# Patient Record
Sex: Female | Born: 1985 | Race: White | Hispanic: No | Marital: Married | Smoking: Never smoker
Health system: Southern US, Community
[De-identification: ages and names within clinical notes are randomized; demographics above are authoritative.]

## PROBLEM LIST (undated history)

## (undated) ENCOUNTER — Inpatient Hospital Stay (HOSPITAL_COMMUNITY): Payer: Self-pay

## (undated) DIAGNOSIS — G43009 Migraine without aura, not intractable, without status migrainosus: Secondary | ICD-10-CM

## (undated) DIAGNOSIS — M2550 Pain in unspecified joint: Secondary | ICD-10-CM

## (undated) DIAGNOSIS — S239XXA Sprain of unspecified parts of thorax, initial encounter: Secondary | ICD-10-CM

## (undated) DIAGNOSIS — M62838 Other muscle spasm: Secondary | ICD-10-CM

## (undated) DIAGNOSIS — J329 Chronic sinusitis, unspecified: Secondary | ICD-10-CM

## (undated) DIAGNOSIS — R202 Paresthesia of skin: Secondary | ICD-10-CM

## (undated) DIAGNOSIS — G35 Multiple sclerosis: Secondary | ICD-10-CM

## (undated) HISTORY — PX: NO PAST SURGERIES: SHX2092

## (undated) HISTORY — DX: Chronic sinusitis, unspecified: J32.9

## (undated) HISTORY — DX: Other muscle spasm: M62.838

## (undated) HISTORY — DX: Migraine without aura, not intractable, without status migrainosus: G43.009

## (undated) HISTORY — DX: Sprain of unspecified parts of thorax, initial encounter: S23.9XXA

## (undated) HISTORY — DX: Pain in unspecified joint: M25.50

## (undated) HISTORY — DX: Paresthesia of skin: R20.2

---
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
## 2014-02-01 ENCOUNTER — Encounter: Payer: Self-pay | Admitting: *Deleted

## 2014-02-01 ENCOUNTER — Telehealth: Payer: Self-pay | Admitting: *Deleted

## 2014-02-01 NOTE — Telephone Encounter (Signed)
Patient was r/s for 02/02/14 at 2 pm, per Carollee Herter

## 2014-02-01 NOTE — Telephone Encounter (Signed)
Spoke with patient to r/s her appointment with Dr. Hosie PoissonSumner, patient was offered 3 pm slot on the same day or an earlier time on another day, states tomorrow is her day off and would need an earlier appointment with any doctor that is available to see her on tomorrow, [facility] to see what can be done.

## 2014-02-02 ENCOUNTER — Encounter (INDEPENDENT_AMBULATORY_CARE_PROVIDER_SITE_OTHER): Payer: Self-pay

## 2014-02-02 ENCOUNTER — Encounter: Payer: Self-pay | Admitting: Neurology

## 2014-02-02 ENCOUNTER — Ambulatory Visit: Payer: PRIVATE HEALTH INSURANCE | Admitting: Neurology

## 2014-02-02 ENCOUNTER — Ambulatory Visit (INDEPENDENT_AMBULATORY_CARE_PROVIDER_SITE_OTHER): Payer: PRIVATE HEALTH INSURANCE | Admitting: Neurology

## 2014-02-02 VITALS — BP 137/89 | HR 94 | Ht 69.0 in | Wt 239.0 lb

## 2014-02-02 DIAGNOSIS — R202 Paresthesia of skin: Secondary | ICD-10-CM

## 2014-02-02 DIAGNOSIS — R209 Unspecified disturbances of skin sensation: Secondary | ICD-10-CM

## 2014-02-02 HISTORY — DX: Paresthesia of skin: R20.2

## 2014-02-02 NOTE — Progress Notes (Signed)
[facility]    Provider:  Dr Hosie Poisson Referring Provider: Margrett Rud, PA-C Primary Care Physician:  No primary provider on file.  CC:  paresthesias  HPI:  Linda Griffith is a 28 y.o. female here as a referral from Dr. Roger Shelter for paresthesias  Patient presents with new onset hand paresthesias in April which have progressed to involve the whole body. Symptoms started in her feet, then hands and progressed to involve the whole body below the neck. Has slightly improved and now not involving the distal portion of her extremities. Described as a constant sensation, swollen and hot/cold sensation, does not fluctuate. Will also feel an occasional cramping sensation in her hands. Has subjective weakness but no true weakness noted, has some difficulty with fine motor tasks. No gait instability. No change with bowel/bladder. No facial sensory or motor changes. No change in vision. No recent illnesses, fevers, colds. No recent medication changes. She was prescribed prednisone by urgent care but did not take. No recent travel, no tic bites.   In the past few years she has had severe migraines, starts with severe blurry vision followed by a headaches. No history of focal motor or sensory changes. Otherwise healthy. Miscarriage in October. History of bulging lumbar disc in October. Currently managing a restaurant. Social EtOH use.   Review of Systems: Out of a complete 14 system review, the patient complains of only the following symptoms, and all other reviewed systems are negative. + headaches, numbness  History   Social History   Marital Status: Married    Spouse Name: N/A    Number of Children: N/A   Years of Education: N/A   Occupational History   Not on file.   Social History Main Topics   Smoking status: Never Smoker    Smokeless tobacco: Never Used   Alcohol Use: Yes     Comment: occ 3-6 drinks per week   Drug Use: No   Sexual Activity: Yes   Other Topics  Concern   Not on file   Social History Narrative   Married, no children   Right handed   Some college   1 cup daily    No family history on file.  Past Medical History  Diagnosis Date   Migraine without aura    Joint pain     knee, and lower leg   Sinusitis    Sprain of thoracic region     Thoracic region   Muscle spasms of lower extremity     No past surgical history on file.  No current outpatient prescriptions on file.   No current facility-administered medications for this visit.    Allergies as of 02/02/2014 - Review Complete 02/02/2014  Allergen Reaction Noted   Amoxicillin  02/01/2014   Penicillins Hives and Rash 02/01/2014    Vitals: BP 137/89   Pulse 94   Ht 5\' 9"  (1.753 m)   Wt 239 lb (108.41 kg)   BMI 35.28 kg/m2 Last Weight:  Wt Readings from Last 1 Encounters:  02/02/14 239 lb (108.41 kg)   Last Height:   Ht Readings from Last 1 Encounters:  02/02/14 5\' 9"  (1.753 m)     Physical exam: Exam: Gen: NAD, conversant Eyes: anicteric sclerae, moist conjunctivae HENT: Atraumatic, oropharynx clear Neck: Trachea midline; supple,  Lungs: CTA, no wheezing, rales, rhonic                          CV: RRR, no  MRG Abdomen: Soft, non-tender;  Extremities: No peripheral edema  Skin: Normal temperature, no rash,  Psych: Appropriate affect, pleasant  Neuro: MS: AA&Ox3, appropriately interactive, normal affect   Speech: fluent w/o paraphasic error  Memory: good recent and remote recall  CN: PERRL, EOMI no nystagmus, no ptosis, sensation intact to LT V1-V3 bilat, face symmetric, no weakness, hearing grossly intact, palate elevates symmetrically, shoulder shrug 5/5 bilat,  tongue protrudes midline, no fasiculations noted.  Motor: normal bulk and tone Strength: 5/5  In all extremities  Coord: rapid alternating and point-to-point (FNF, HTS) movements intact.  Reflexes: symmetrical, bilat downgoing toes  Sens: LT intact in all extremities  with exception of decreased LT in ulnar distribution of bilateral hands. Negative Tinels, Phalens  Gait: posture, stance, stride and arm-swing normal. Tandem gait intact. Able to walk on heels and toes. Romberg absent.   Assessment:  After physical and neurologic examination, review of laboratory studies, imaging, neurophysiology testing and pre-existing records, assessment will be reviewed on the problem list.  Plan:  Treatment plan and additional workup will be reviewed under Problem List.  1)Paresthesias  27y/o woman presenting for initial evaluation of diffuse paresthesias. Unclear etiology. With age and symptom presentation would need to consider an autoimmune disorder such as MS. With signs of bilateral ulnar neuropathy would consider a cervical process or an underlying disorder of the peripheral nerves. Will check MRI brain and C spine. Will check lab workup. Can consider EMG/NCS in the future. Follow up once workup completed.    Elspeth ChoPeter Daquane Aguilar, DO  [facility] [address] [place], [place] [id number]  Phone [phone] Fax [phone]

## 2014-02-02 NOTE — Patient Instructions (Signed)
Overall you are doing fairly well but I do want to suggest a few things today:   As far as diagnostic testing:  1)Please have some blood work completed today after you check out 2)I would like you to have a MRI of your brain and cervical spine. You will be called to schedule this  I will call you once the above is completed to discuss next steps. Please call us with any interim questions, concerns, problems, updates or refill requests.   My clinical assistant and will answer any of your questions and relay your messages to me and also relay most of my messages to you.   Our phone number is [phone]. We also have an after hours call service for urgent matters and there is a physician on-call for urgent questions. For any emergencies you know to call 911 or go to the nearest emergency room

## 2014-02-03 NOTE — Progress Notes (Signed)
Quick Note:  Shared negative lab results with patient per Dr Minus Breeding findings and patient said that she would like for him to call her to discuss in more details ______

## 2014-02-05 LAB — PROTEIN ELECTROPHORESIS
A/G Ratio: 1.6 (ref 0.7–2.0)
ALPHA 2: 0.6 g/dL (ref 0.4–1.2)
Albumin ELP: 4.2 g/dL (ref 3.2–5.6)
Alpha 1: 0.2 g/dL (ref 0.1–0.4)
BETA: 0.9 g/dL (ref 0.6–1.3)
GLOBULIN, TOTAL: 2.6 g/dL (ref 2.0–4.5)
Gamma Globulin: 0.9 g/dL (ref 0.5–1.6)
Total Protein: 6.8 g/dL (ref 6.0–8.5)

## 2014-02-05 LAB — C-REACTIVE PROTEIN: CRP: 2.1 mg/L (ref 0.0–4.9)

## 2014-02-05 LAB — METHYLMALONIC ACID(MMA), RND URINE

## 2014-02-05 LAB — VITAMIN B12: Vitamin B-12: 408 pg/mL (ref 211–946)

## 2014-02-05 LAB — ANA W/REFLEX IF POSITIVE: Anti Nuclear Antibody(ANA): NEGATIVE

## 2014-02-05 LAB — SEDIMENTATION RATE: Sed Rate: 6 mm/hr (ref 0–32)

## 2014-02-17 ENCOUNTER — Ambulatory Visit (INDEPENDENT_AMBULATORY_CARE_PROVIDER_SITE_OTHER): Payer: PRIVATE HEALTH INSURANCE

## 2014-02-17 DIAGNOSIS — R202 Paresthesia of skin: Secondary | ICD-10-CM

## 2014-02-17 DIAGNOSIS — R209 Unspecified disturbances of skin sensation: Secondary | ICD-10-CM

## 2014-02-17 MED ORDER — GADOPENTETATE DIMEGLUMINE 469.01 MG/ML IV SOLN: 20.0000 mL | Freq: Once | INTRAVENOUS | Status: AC | PRN

## 2014-02-19 ENCOUNTER — Other Ambulatory Visit: Payer: Self-pay | Admitting: Neurology

## 2014-02-19 ENCOUNTER — Telehealth: Payer: Self-pay | Admitting: Neurology

## 2014-02-19 DIAGNOSIS — R202 Paresthesia of skin: Secondary | ICD-10-CM

## 2014-02-19 NOTE — Telephone Encounter (Signed)
Called patient back. Will order LP for MS workup. Patient expressed  Understanding and agreement.

## 2014-02-23 ENCOUNTER — Telehealth: Payer: Self-pay | Admitting: *Deleted

## 2014-02-23 NOTE — Telephone Encounter (Signed)
I called pt and relayed the plan.  [facility] to call. She verbalized understanding.

## 2014-02-23 NOTE — Telephone Encounter (Signed)
I called and LMVM for Danielle at [facility] about LP ordered 02-19-14 on pt.

## 2014-02-24 NOTE — Telephone Encounter (Signed)
LMVM for Linda Griffith about scheduling LP for pt.

## 2014-02-24 NOTE — Telephone Encounter (Signed)
Spoke with Duwayne Heck, and she is aware of order for LP.  Did contact pt.

## 2014-03-01 DIAGNOSIS — G35 Multiple sclerosis: Secondary | ICD-10-CM

## 2014-03-01 HISTORY — DX: Multiple sclerosis: G35

## 2014-03-08 ENCOUNTER — Ambulatory Visit
Admission: RE | Admit: 2014-03-08 | Discharge: 2014-03-08 | Disposition: A | Discharge status: Home / Self Care | Payer: PRIVATE HEALTH INSURANCE | Source: Ambulatory Visit | Attending: Neurology | Admitting: Neurology

## 2014-03-08 VITALS — BP 135/70 | HR 83

## 2014-03-08 DIAGNOSIS — R202 Paresthesia of skin: Secondary | ICD-10-CM

## 2014-03-08 LAB — CSF CELL COUNT WITH DIFFERENTIAL
RBC COUNT CSF: 6 uL — AB
Tube #: 1
WBC CSF: 7 uL — AB (ref 0–5)

## 2014-03-08 LAB — GRAM STAIN: GRAM STAIN: NONE SEEN

## 2014-03-08 LAB — GLUCOSE, CSF: GLUCOSE CSF: 58 mg/dL (ref 43–76)

## 2014-03-08 LAB — PROTEIN, CSF: Total Protein, CSF: 47 mg/dL — ABNORMAL HIGH (ref 15–45)

## 2014-03-08 NOTE — Discharge Instructions (Signed)

## 2014-03-08 NOTE — Procedures (Addendum)
°

## 2014-03-08 NOTE — Progress Notes (Signed)
Blood drawn from right AC to go with spinal fluid. Site is unremarkable and pt tolerated procedure well.

## 2014-03-09 ENCOUNTER — Telehealth: Payer: Self-pay | Admitting: *Deleted

## 2014-03-09 NOTE — Telephone Encounter (Signed)
Spoke with patient to inform that the physician has to review the results once received which are not ready yet  then we will call with those LP results. Explained that per Dr Skip EstimableSumner's lov notes, will f/u once workup is completed, she verbalized understanding.

## 2014-03-12 ENCOUNTER — Telehealth: Payer: Self-pay | Admitting: Neurology

## 2014-03-12 LAB — MYELIN BASIC PROTEIN, CSF

## 2014-03-12 LAB — OLIGOCLONAL BANDS, CSF + SERM

## 2014-03-12 NOTE — Telephone Encounter (Signed)
Called patient no answer. Left a message stating to call the office back to set up an appointment on 03-16-14 at 2:45pm for a 3 pm appointment.

## 2014-03-12 NOTE — Telephone Encounter (Signed)
Patient calling to get results from Lumbar Puncture.  Please return call.

## 2014-03-12 NOTE — Telephone Encounter (Signed)
Please let her know I am still waiting for one test to come back. It may take a few more days. Please have her schedule a follow up with me for 6/16 or 6/17 at 3pm. At that point we will have everything back and can discuss next steps. Thanks.

## 2014-03-12 NOTE — Telephone Encounter (Signed)
Patient called back and spoke to GP and she is now scheduled for 03-16-14 @ 3 pm.

## 2014-03-12 NOTE — Telephone Encounter (Signed)
Patient is calling wanting to know if LP results are back. Please advise

## 2014-03-13 LAB — HSV(HERPES SMPLX VRS)ABS-I+II(IGG)-CSF

## 2014-03-16 ENCOUNTER — Encounter: Payer: Self-pay | Admitting: Neurology

## 2014-03-16 ENCOUNTER — Ambulatory Visit (INDEPENDENT_AMBULATORY_CARE_PROVIDER_SITE_OTHER): Payer: PRIVATE HEALTH INSURANCE | Admitting: Neurology

## 2014-03-16 VITALS — BP 133/73 | HR 107 | Ht 69.0 in | Wt 238.0 lb

## 2014-03-16 DIAGNOSIS — R202 Paresthesia of skin: Secondary | ICD-10-CM

## 2014-03-16 DIAGNOSIS — R209 Unspecified disturbances of skin sensation: Secondary | ICD-10-CM

## 2014-03-16 NOTE — Progress Notes (Signed)
[facility]    Provider:  Dr Hosie Poisson Referring Provider: No ref. provider found Primary Care Physician:  No primary provider on file.  CC:  paresthesias  HPI:  Linda Griffith is a 28 y.o. female here as a follow up from Dr. No ref. provider found for paresthesias. Last visit was 02/02/2014 at which time she underwent a MS workup. This showed a mildly enhancing cervical spine lesion and >5 oligoclonal bands in the CSF.   Prior visit 01/2014 Patient presents with new onset hand paresthesias in April which have progressed to involve the whole body. Symptoms started in her feet, then hands and progressed to involve the whole body below the neck. Has slightly improved and now not involving the distal portion of her extremities. Described as a constant sensation, swollen and hot/cold sensation, does not fluctuate. Will also feel an occasional cramping sensation in her hands. Has subjective weakness but no true weakness noted, has some difficulty with fine motor tasks. No gait instability. No change with bowel/bladder. No facial sensory or motor changes. No change in vision. No recent illnesses, fevers, colds. No recent medication changes. She was prescribed prednisone by urgent care but did not take. No recent travel, no tic bites.   In the past few years she has had severe migraines, starts with severe blurry vision followed by a headaches. No history of focal motor or sensory changes. Otherwise healthy. Miscarriage in October. History of bulging lumbar disc in October. Currently managing a restaurant. Social EtOH use.   Review of Systems: Out of a complete 14 system review, the patient complains of only the following symptoms, and all other reviewed systems are negative. + headaches, numbness  History   Social History   Marital Status: Married    Spouse Name: N/A    Number of Children: N/A   Years of Education: N/A   Occupational History   Not on file.   Social  History Main Topics   Smoking status: Never Smoker    Smokeless tobacco: Never Used   Alcohol Use: Yes     Comment: occ 3-6 drinks per week   Drug Use: No   Sexual Activity: Yes   Other Topics Concern   Not on file   Social History Narrative   Married, no children   Right handed   Some college   1 cup daily    No family history on file.  Past Medical History  Diagnosis Date   Migraine without aura    Joint pain     knee, and lower leg   Sinusitis    Sprain of thoracic region     Thoracic region   Muscle spasms of lower extremity    Paresthesias 02/02/2014    No past surgical history on file.  No current outpatient prescriptions on file.   No current facility-administered medications for this visit.    Allergies as of 03/16/2014 - Review Complete 03/16/2014  Allergen Reaction Noted   Amoxicillin  02/01/2014   Penicillins Hives and Rash 02/01/2014    Vitals: BP 133/73   Pulse 107   Ht 5\' 9"  (1.753 m)   Wt 238 lb (107.956 kg)   BMI 35.13 kg/m2 Last Weight:  Wt Readings from Last 1 Encounters:  03/16/14 238 lb (107.956 kg)   Last Height:   Ht Readings from Last 1 Encounters:  03/16/14 5\' 9"  (1.753 m)     Physical exam: Exam: Gen: NAD, conversant Eyes: anicteric sclerae, moist conjunctivae HENT: Atraumatic, oropharynx  clear Neck: Trachea midline; supple,  Lungs: CTA, no wheezing, rales, rhonic                          CV: RRR, no MRG Abdomen: Soft, non-tender;  Extremities: No peripheral edema  Skin: Normal temperature, no rash,  Psych: Appropriate affect, pleasant  Neuro: MS: AA&Ox3, appropriately interactive, normal affect   Speech: fluent w/o paraphasic error  Memory: good recent and remote recall  CN: PERRL, EOMI no nystagmus, no ptosis, sensation intact to LT V1-V3 bilat, face symmetric, no weakness, hearing grossly intact, palate elevates symmetrically, shoulder shrug 5/5 bilat,  tongue protrudes midline, no fasiculations  noted.  Motor: normal bulk and tone Strength: 5/5  In all extremities  Coord: rapid alternating and point-to-point (FNF, HTS) movements intact.  Reflexes: symmetrical, bilat downgoing toes  Sens: LT intact in all extremities with exception of decreased LT in ulnar distribution of bilateral hands. Negative Tinels, Phalens  Gait: posture, stance, stride and arm-swing normal. Tandem gait intact. Able to walk on heels and toes. Romberg absent.   Assessment:  After physical and neurologic examination, review of laboratory studies, imaging, neurophysiology testing and pre-existing records, assessment will be reviewed on the problem list.  Plan:  Treatment plan and additional workup will be reviewed under Problem List.  1)Possible MS  27y/o woman presenting for follow up evaluation of diffuse paresthesias. MRI imaging shows a solitary mildly enhancing lesion at the level of C2. LP shows greater than 5 oligoclonal bands. Based on solitary lesion this would represent a CIS but suspect it is likely a case of MS. Discussed this extensively with the patient and her mother. We discussed different therapeutic options vs watchful waiting. They wish to think about it at this time. We will discuss further once they have time to do further research.    A total of 45 minutes was spent in with this patient. Over half this time was spent on counseling patient on the diagnosis and different therapeutic options available.     Elspeth ChoPeter Amilio Zehnder, DO  [facility] [address] [place], [place] [id number]  Phone [phone] Fax [phone]

## 2014-07-26 IMAGING — RF DG FLUORO GUIDE LUMBAR PUNCTURE
1 series · 1 of 1 positions shown · non-contrast
Comparison: none

CLINICAL DATA: Hand tingling

[Series 1: dg fluoro guide lumbar puncture · 1 of 1 slices shown]
[im 1/1]
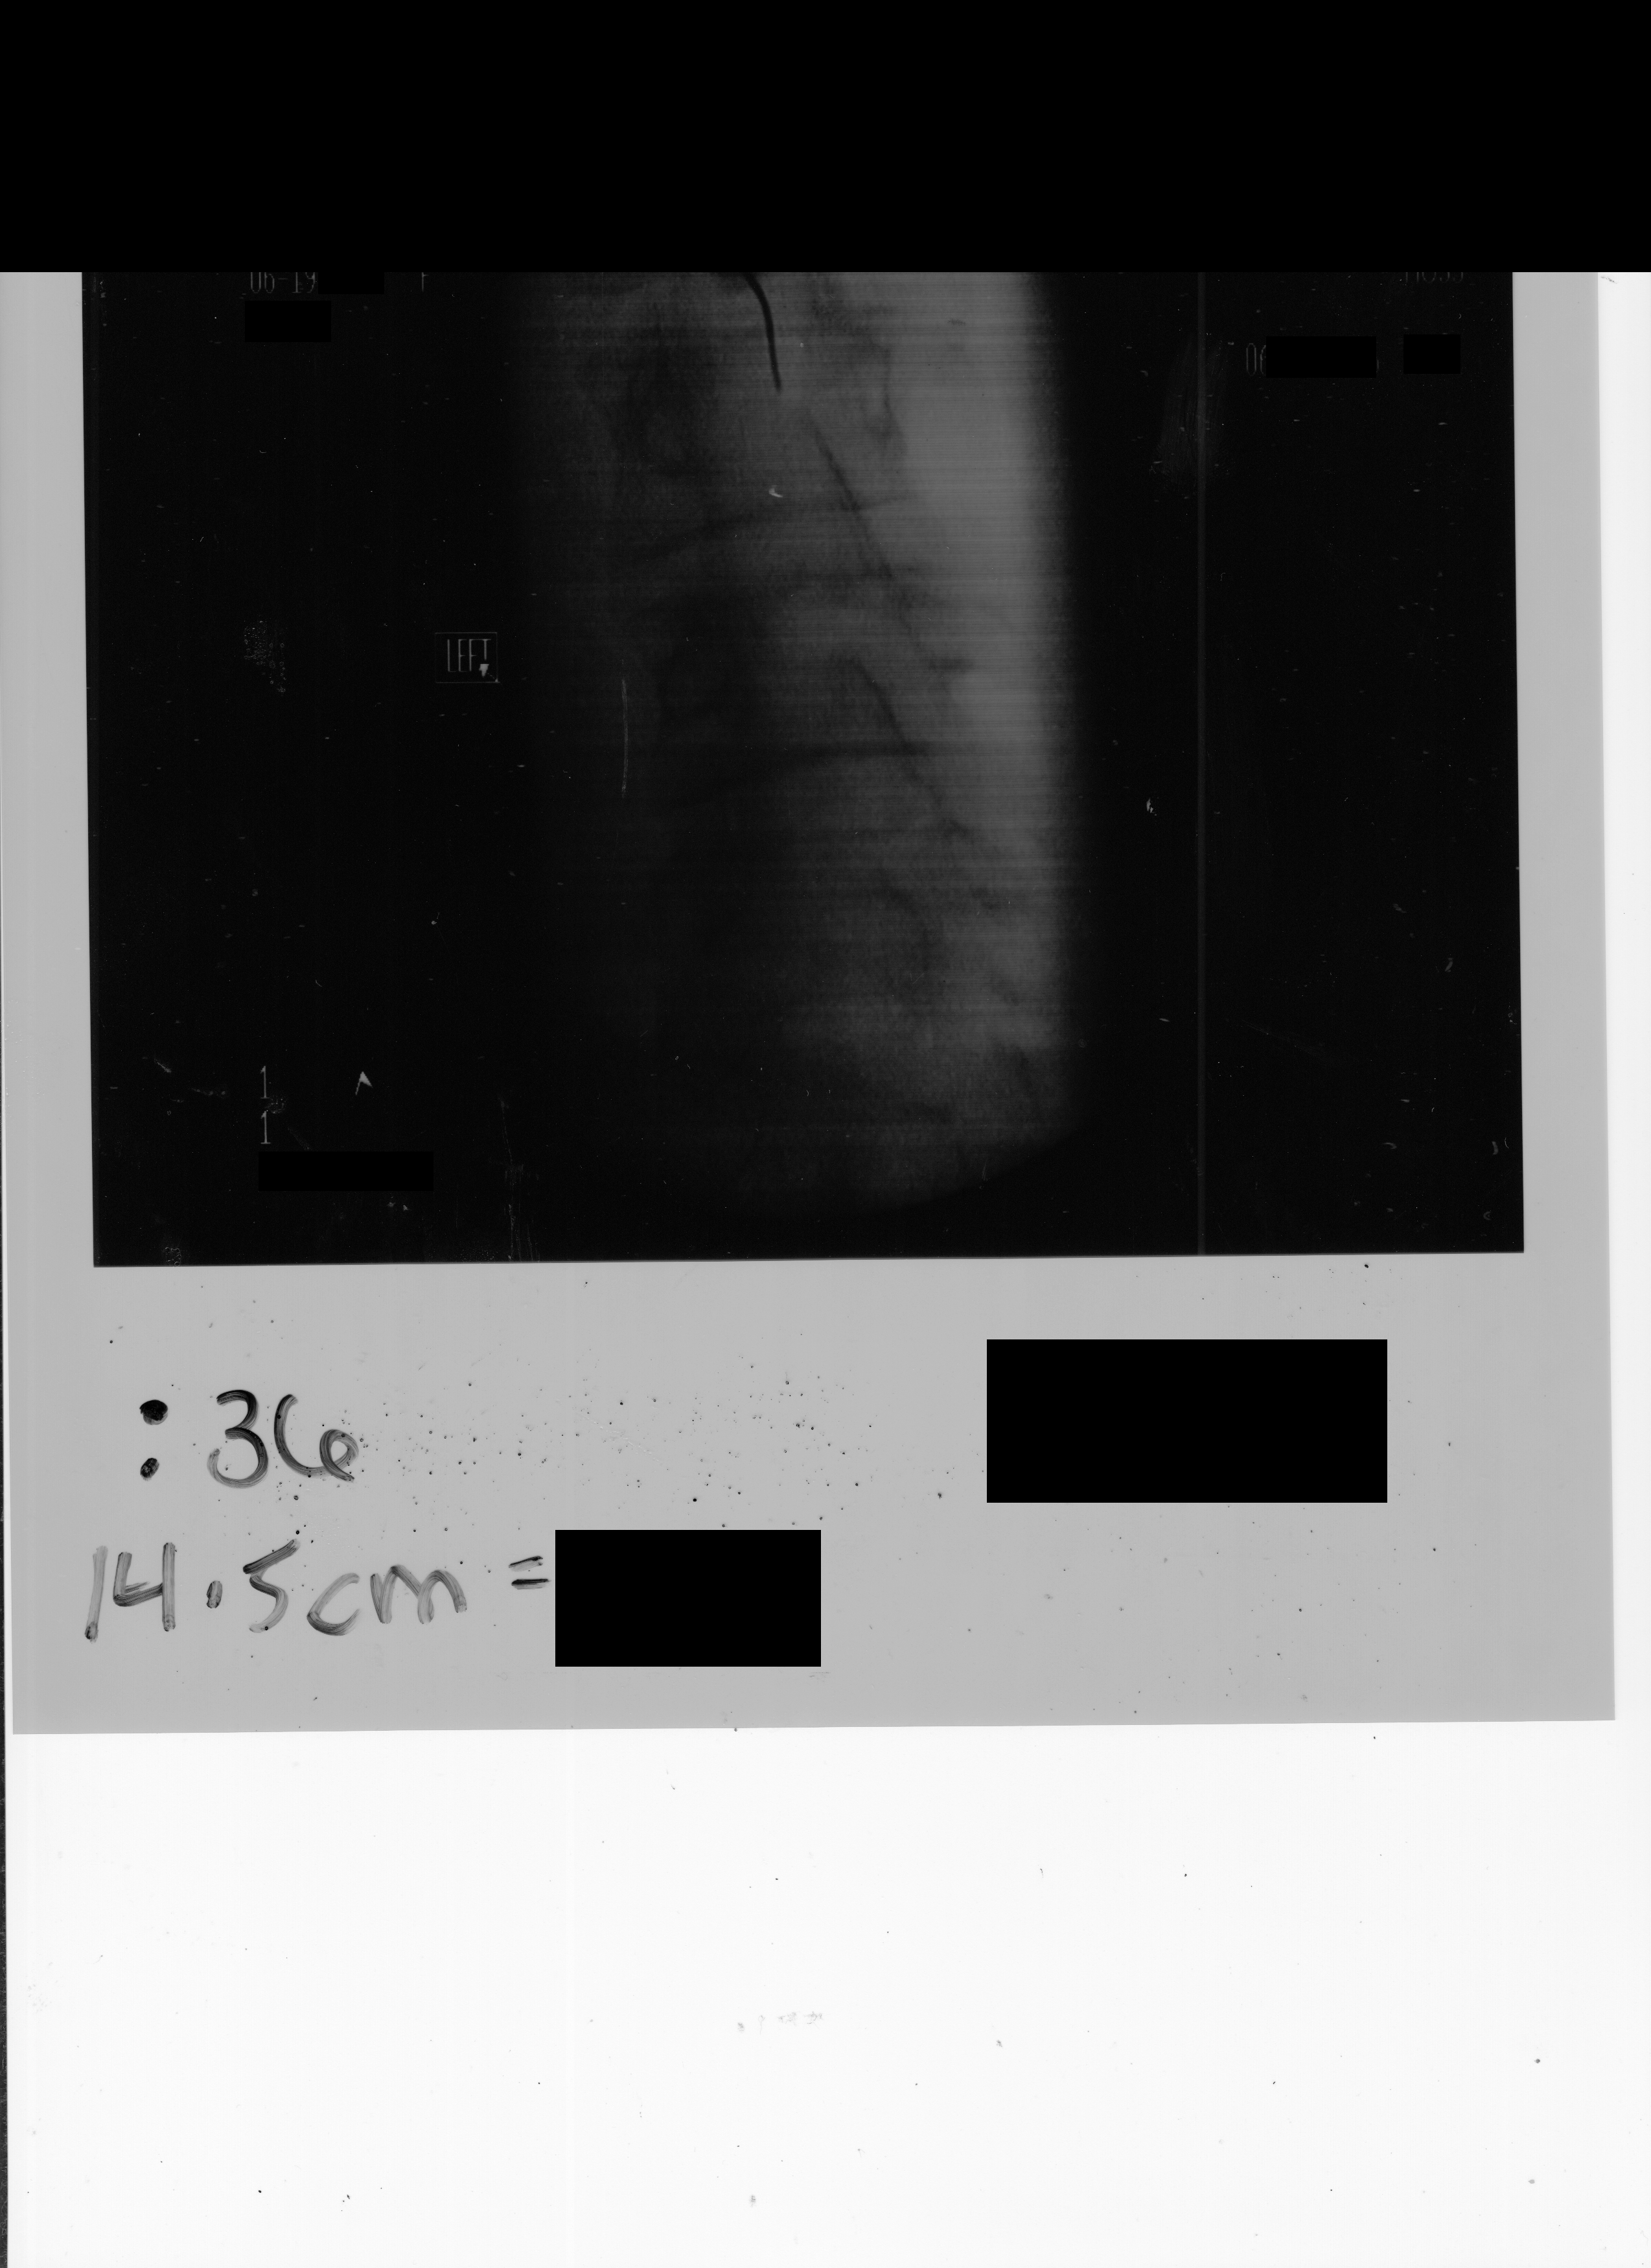

[1 of 1 positions shown; findings below may reference images not displayed]

EXAM:
DIAGNOSTIC LUMBAR PUNCTURE UNDER FLUOROSCOPIC GUIDANCE

FLUOROSCOPY TIME:  36 seconds.

PROCEDURE:
Informed consent was obtained from the patient prior to the
procedure, including potential complications of headache, allergy,
and pain. With the patient prone, the lower back was prepped with
Betadine. 1% Lidocaine was used for local anesthesia. Lumbar
puncture was performed at the L2-3 level using a 20 gauge needle
with return of clear CSF with an opening pressure of 14.5 cm water.
Twelve ml of CSF were obtained for laboratory studies. The patient
tolerated the procedure well and there were no apparent
complications.
IMPRESSION: Successful lumbar puncture for fluid and pressure measurements.

## 2014-09-28 LAB — OB RESULTS CONSOLE RUBELLA ANTIBODY, IGM: RUBELLA: IMMUNE

## 2014-09-28 LAB — OB RESULTS CONSOLE RPR: RPR: NONREACTIVE

## 2014-09-28 LAB — OB RESULTS CONSOLE HEPATITIS B SURFACE ANTIGEN: Hepatitis B Surface Ag: NEGATIVE

## 2014-09-28 LAB — OB RESULTS CONSOLE HIV ANTIBODY (ROUTINE TESTING): HIV: NONREACTIVE

## 2014-10-12 LAB — OB RESULTS CONSOLE GC/CHLAMYDIA
Chlamydia: NEGATIVE
GC PROBE AMP, GENITAL: NEGATIVE

## 2014-11-01 ENCOUNTER — Inpatient Hospital Stay (HOSPITAL_COMMUNITY)
Admission: AD | Admit: 2014-11-01 | Discharge: 2014-11-02 | Disposition: A | Discharge status: Home / Self Care | Payer: PRIVATE HEALTH INSURANCE | Source: Ambulatory Visit | Attending: Obstetrics and Gynecology | Admitting: Obstetrics and Gynecology

## 2014-11-01 ENCOUNTER — Encounter (HOSPITAL_COMMUNITY): Payer: Self-pay | Admitting: *Deleted

## 2014-11-01 DIAGNOSIS — O210 Mild hyperemesis gravidarum: Secondary | ICD-10-CM | POA: Diagnosis present

## 2014-11-01 DIAGNOSIS — Z3A13 13 weeks gestation of pregnancy: Secondary | ICD-10-CM | POA: Insufficient documentation

## 2014-11-01 DIAGNOSIS — O219 Vomiting of pregnancy, unspecified: Secondary | ICD-10-CM

## 2014-11-01 HISTORY — DX: Multiple sclerosis: G35

## 2014-11-01 LAB — URINALYSIS, ROUTINE W REFLEX MICROSCOPIC
GLUCOSE, UA: NEGATIVE mg/dL
LEUKOCYTES UA: NEGATIVE
NITRITE: NEGATIVE
PH: 6 (ref 5.0–8.0)
Protein, ur: NEGATIVE mg/dL
Specific Gravity, Urine: >1.03 — ABNORMAL HIGH (ref 1.005–1.030)
Urobilinogen, UA: 0.2 mg/dL (ref 0.0–1.0)

## 2014-11-01 LAB — URINE MICROSCOPIC-ADD ON

## 2014-11-01 MED ORDER — DEXTROSE IN LACTATED RINGERS 5 % IV SOLN
Freq: Once | INTRAVENOUS | Status: AC
  Administered 2014-11-01: 23:00:00 via INTRAVENOUS

## 2014-11-01 MED ORDER — SODIUM CHLORIDE 0.9 % IV SOLN
25.0000 mg | Freq: Once | INTRAVENOUS | Status: AC
  Administered 2014-11-01: 25 mg via INTRAVENOUS
  Filled 2014-11-01: qty 1

## 2014-11-01 NOTE — Discharge Instructions (Signed)

## 2014-11-01 NOTE — MAU Provider Note (Signed)
°  History     CSN: [id number]  Arrival date and time: 11/01/14 1948   None     No chief complaint on file.  HPI This is a 29 y.o. female at [redacted]w[redacted]d who presents with c/o nausea and vomiting since running out of Diclegis on Friday. Was hoping N/V would get better so did not refill it. Did get good relief from it when she was on it.   RN note: PT SAYS SHE HAS BEEN NAUSEAED WHOLE TIME- WAS TAKING DIGLEGIS-  VOMITED X15-20 X TODAY. LAST ATE- AT 2 PROTEINS SHE CALLED DR       OB History    Gravida Para Term Preterm AB TAB SAB Ectopic Multiple Living   3    1  1          Past Medical History  Diagnosis Date   Migraine without aura    Joint pain     knee, and lower leg   Sinusitis    Sprain of thoracic region     Thoracic region   Muscle spasms of lower extremity    Paresthesias 02/02/2014   Multiple sclerosis 03/2014    Past Surgical History  Procedure Laterality Date   No past surgeries      History reviewed. No pertinent family history.  History  Substance Use Topics   Smoking status: Never Smoker    Smokeless tobacco: Never Used   Alcohol Use: Yes     Comment: occ 3-6 drinks per week    Allergies:  Allergies  Allergen Reactions   Amoxicillin    Penicillins Hives and Rash    No prescriptions prior to admission    Review of Systems  Constitutional: Positive for malaise/fatigue. Negative for fever and chills.  Gastrointestinal: Positive for nausea and vomiting. Negative for abdominal pain, diarrhea and constipation.  Neurological: Positive for weakness. Negative for dizziness.   Physical Exam   Blood pressure 123/73, pulse 104, temperature 99.6 F (37.6 C), temperature source Oral, resp. rate 20, height 5\' 7"  (1.702 m), weight 219 lb (99.338 kg).  Physical Exam  Constitutional: She is oriented to person, place, and time. She appears well-developed and well-nourished.  HENT:  Head: Normocephalic.   Cardiovascular: Normal rate and normal heart sounds.  Exam reveals no gallop and no friction rub.   No murmur heard. Respiratory: Effort normal and breath sounds normal. No respiratory distress.  GI: Soft.  Musculoskeletal: Normal range of motion.  Neurological: She is alert and oriented to person, place, and time.  Skin: Skin is warm and dry.  Psychiatric: She has a normal mood and affect.    MAU Course  Procedures  MDM Will give 2 liters of fluid, including one with Phenergan. >> felt better afterward.  Able to tolerate ginger ale   Assessment and Plan  A:  SIUP at [redacted]w[redacted]d       Nausea and vomiting  P:  Discharge home       Advised to take Diclegis as ordered       Follow up in office  [facility] 11/01/2014, 8:55 PM

## 2014-11-01 NOTE — MAU Note (Signed)
PT  SAYS SHE HAS BEEN NAUSEAED   WHOLE  TIME-  WAS TAKING  DIGLEGIS-        VOMITED   X15-20 X   TODAY.    LAST  ATE- AT  2  PROTEINS    SHE    CALLED  DR

## 2015-04-11 LAB — OB RESULTS CONSOLE GBS: GBS: POSITIVE

## 2015-04-29 ENCOUNTER — Inpatient Hospital Stay (HOSPITAL_COMMUNITY)
Admission: AD | Admit: 2015-04-29 | Discharge: 2015-05-02 | DRG: 774 | Disposition: A | Discharge status: Home / Self Care | Payer: PRIVATE HEALTH INSURANCE | Source: Ambulatory Visit | Attending: Obstetrics and Gynecology | Admitting: Obstetrics and Gynecology

## 2015-04-29 ENCOUNTER — Encounter (HOSPITAL_COMMUNITY): Payer: Self-pay | Admitting: *Deleted

## 2015-04-29 DIAGNOSIS — Z8249 Family history of ischemic heart disease and other diseases of the circulatory system: Secondary | ICD-10-CM

## 2015-04-29 DIAGNOSIS — O1490 Unspecified pre-eclampsia, unspecified trimester: Secondary | ICD-10-CM | POA: Diagnosis present

## 2015-04-29 DIAGNOSIS — O99354 Diseases of the nervous system complicating childbirth: Secondary | ICD-10-CM | POA: Diagnosis present

## 2015-04-29 DIAGNOSIS — R51 Headache: Secondary | ICD-10-CM | POA: Diagnosis present

## 2015-04-29 DIAGNOSIS — O1493 Unspecified pre-eclampsia, third trimester: Secondary | ICD-10-CM | POA: Diagnosis present

## 2015-04-29 DIAGNOSIS — G35 Multiple sclerosis: Secondary | ICD-10-CM | POA: Diagnosis present

## 2015-04-29 DIAGNOSIS — Z3A39 39 weeks gestation of pregnancy: Secondary | ICD-10-CM | POA: Diagnosis present

## 2015-04-29 LAB — ABO/RH: ABO/RH(D): A POS

## 2015-04-29 LAB — CBC WITH DIFFERENTIAL/PLATELET
BASOS ABS: 0 10*3/uL (ref 0.0–0.1)
Basophils Relative: 0 % (ref 0–1)
Eosinophils Absolute: 0.1 10*3/uL (ref 0.0–0.7)
Eosinophils Relative: 1 % (ref 0–5)
HCT: 38.4 % (ref 36.0–46.0)
Hemoglobin: 13.5 g/dL (ref 12.0–15.0)
LYMPHS PCT: 11 % — AB (ref 12–46)
Lymphs Abs: 1.3 10*3/uL (ref 0.7–4.0)
MCH: 33.9 pg (ref 26.0–34.0)
MCHC: 35.2 g/dL (ref 30.0–36.0)
MCV: 96.5 fL (ref 78.0–100.0)
Monocytes Absolute: 0.7 10*3/uL (ref 0.1–1.0)
Monocytes Relative: 6 % (ref 3–12)
NEUTROS ABS: 10.2 10*3/uL — AB (ref 1.7–7.7)
Neutrophils Relative %: 82 % — ABNORMAL HIGH (ref 43–77)
Platelets: 180 10*3/uL (ref 150–400)
RBC: 3.98 MIL/uL (ref 3.87–5.11)
RDW: 12.8 % (ref 11.5–15.5)
WBC: 12.3 10*3/uL — AB (ref 4.0–10.5)

## 2015-04-29 LAB — COMPREHENSIVE METABOLIC PANEL
ALT: 16 U/L (ref 14–54)
AST: 17 U/L (ref 15–41)
Albumin: 3.7 g/dL (ref 3.5–5.0)
Alkaline Phosphatase: 116 U/L (ref 38–126)
Anion gap: 5 (ref 5–15)
BUN: 11 mg/dL (ref 6–20)
CHLORIDE: 105 mmol/L (ref 101–111)
CO2: 24 mmol/L (ref 22–32)
Calcium: 9.4 mg/dL (ref 8.9–10.3)
Creatinine, Ser: 0.74 mg/dL (ref 0.44–1.00)
GFR calc Af Amer: >60 mL/min (ref >60)
Glucose, Bld: 75 mg/dL (ref 65–99)
Potassium: 4.5 mmol/L (ref 3.5–5.1)
Sodium: 134 mmol/L — ABNORMAL LOW (ref 135–145)
TOTAL PROTEIN: 6.8 g/dL (ref 6.5–8.1)
Total Bilirubin: 0.7 mg/dL (ref 0.3–1.2)

## 2015-04-29 LAB — PROTEIN / CREATININE RATIO, URINE
Creatinine, Urine: 168 mg/dL
Protein Creatinine Ratio: 0.1 mg/mg{Cre} (ref 0.00–0.15)
TOTAL PROTEIN, URINE: 17 mg/dL

## 2015-04-29 LAB — URINALYSIS, ROUTINE W REFLEX MICROSCOPIC
BILIRUBIN URINE: NEGATIVE
Glucose, UA: NEGATIVE mg/dL
Hgb urine dipstick: NEGATIVE
KETONES UR: 15 mg/dL — AB
Leukocytes, UA: NEGATIVE
Nitrite: NEGATIVE
Protein, ur: NEGATIVE mg/dL
Specific Gravity, Urine: >1.03 — ABNORMAL HIGH (ref 1.005–1.030)
UROBILINOGEN UA: 0.2 mg/dL (ref 0.0–1.0)
pH: 5.5 (ref 5.0–8.0)

## 2015-04-29 LAB — LACTATE DEHYDROGENASE: LDH: 163 U/L (ref 98–192)

## 2015-04-29 LAB — URIC ACID: URIC ACID, SERUM: 6.1 mg/dL (ref 2.3–6.6)

## 2015-04-29 LAB — TYPE AND SCREEN
ABO/RH(D): A POS
Antibody Screen: NEGATIVE

## 2015-04-29 MED ORDER — OXYCODONE-ACETAMINOPHEN 5-325 MG PO TABS
2.0000 | ORAL_TABLET | ORAL | Status: DC | PRN
Start: 2015-04-29 — End: 2015-04-30

## 2015-04-29 MED ORDER — TERBUTALINE SULFATE 1 MG/ML IJ SOLN: 0.2500 mg | Freq: Once | INTRAMUSCULAR | Status: AC | PRN

## 2015-04-29 MED ORDER — LIDOCAINE HCL (PF) 1 % IJ SOLN
30.0000 mL | INTRAMUSCULAR | Status: DC | PRN
Start: 2015-04-29 — End: 2015-04-30
  Administered 2015-04-30: 30 mL via SUBCUTANEOUS
  Filled 2015-04-29: qty 30

## 2015-04-29 MED ORDER — OXYCODONE-ACETAMINOPHEN 5-325 MG PO TABS
1.0000 | ORAL_TABLET | ORAL | Status: DC | PRN
Start: 2015-04-29 — End: 2015-04-30

## 2015-04-29 MED ORDER — MAGNESIUM SULFATE BOLUS VIA INFUSION
4.0000 g | Freq: Once | INTRAVENOUS | Status: AC
  Administered 2015-04-29: 4 g via INTRAVENOUS
  Filled 2015-04-29: qty 500

## 2015-04-29 MED ORDER — BUTORPHANOL TARTRATE 1 MG/ML IJ SOLN: 1.0000 mg | INTRAMUSCULAR | Status: DC | PRN

## 2015-04-29 MED ORDER — CITRIC ACID-SODIUM CITRATE 334-500 MG/5ML PO SOLN
30.0000 mL | ORAL | Status: DC | PRN
  Filled 2015-04-29: qty 30

## 2015-04-29 MED ORDER — OXYTOCIN 40 UNITS IN LACTATED RINGERS INFUSION - SIMPLE MED: 62.5000 mL/h | INTRAVENOUS | Status: DC

## 2015-04-29 MED ORDER — LACTATED RINGERS IV SOLN
INTRAVENOUS | Status: DC
Start: 2015-04-29 — End: 2015-04-30
  Administered 2015-04-29 – 2015-04-30 (×3): via INTRAVENOUS

## 2015-04-29 MED ORDER — CLINDAMYCIN PHOSPHATE 900 MG/50ML IV SOLN
900.0000 mg | Freq: Three times a day (TID) | INTRAVENOUS | Status: DC
  Administered 2015-04-30: 900 mg via INTRAVENOUS
  Filled 2015-04-29 (×3): qty 50

## 2015-04-29 MED ORDER — OXYTOCIN BOLUS FROM INFUSION
500.0000 mL | INTRAVENOUS | Status: DC
  Administered 2015-04-30: 500 mL via INTRAVENOUS

## 2015-04-29 MED ORDER — MISOPROSTOL 25 MCG QUARTER TABLET
25.0000 ug | ORAL_TABLET | ORAL | Status: DC | PRN
  Administered 2015-04-29: 25 ug via VAGINAL
  Filled 2015-04-29: qty 1
  Filled 2015-04-29: qty 0.25

## 2015-04-29 MED ORDER — ACETAMINOPHEN 325 MG PO TABS: 650.0000 mg | ORAL_TABLET | ORAL | Status: DC | PRN

## 2015-04-29 MED ORDER — ONDANSETRON HCL 4 MG/2ML IJ SOLN: 4.0000 mg | Freq: Four times a day (QID) | INTRAMUSCULAR | Status: DC | PRN

## 2015-04-29 MED ORDER — MAGNESIUM SULFATE 50 % IJ SOLN
2.0000 g/h | INTRAVENOUS | Status: DC
  Filled 2015-04-29: qty 80

## 2015-04-29 MED ORDER — LACTATED RINGERS IV SOLN: 500.0000 mL | INTRAVENOUS | Status: DC | PRN

## 2015-04-29 MED ORDER — FLEET ENEMA 7-19 GM/118ML RE ENEM: 1.0000 | ENEMA | RECTAL | Status: DC | PRN

## 2015-04-29 MED ORDER — FAMOTIDINE 40 MG/5ML PO SUSR: 40.0000 mg | Freq: Every day | ORAL | Status: DC

## 2015-04-29 MED ORDER — FAMOTIDINE 20 MG PO TABS
40.0000 mg | ORAL_TABLET | Freq: Every day | ORAL | Status: DC
  Administered 2015-04-29: 40 mg via ORAL
  Filled 2015-04-29: qty 2

## 2015-04-29 NOTE — Progress Notes (Signed)
Cx 1/60/-2 vtx D/W patient 2 stage induction and risks

## 2015-04-29 NOTE — MAU Note (Addendum)
C/o headache earlier today and some visual changes earlier also; having high BP readings today; sent by OB's office for PIH eval;

## 2015-04-29 NOTE — MAU Note (Signed)
Early this morning had visual blurring, hx of migraines- this is how they start.  Did feel a headache starting, drank some caffeine.  No longer has blurring, does have dull HA.  About later left hand went numb, then arm, then left side of face and tongue; this was around 1200; lasted about .  No longer having these symptoms.  Has been dx with MS.  Denies BP concerns with preg.

## 2015-04-29 NOTE — MAU Note (Signed)
Urine in lab

## 2015-04-29 NOTE — MAU Provider Note (Signed)
History     CSN: [id number]  Arrival date and time: 04/29/15 1507   First Provider Initiated Contact with Patient 04/29/15 1623      Chief Complaint  Patient presents with   Headache   Blurred Vision   HPI  Ms. Linda Griffith is a 29 y.o. G3P0010 at [redacted]w[redacted]d who presents to MAU today with complaint of elevated blood pressure. The patient has been taking BPs at home as advised by her OB. She states that earlier today she had headache and blurred vision, which resolved spontaneously. She denies floaters, abdominal pain, peripheral edema, contractions, vaginal bleeding or LOF. She reports good fetal movement.   OB History    Gravida Para Term Preterm AB TAB SAB Ectopic Multiple Living   3    1  1          Past Medical History  Diagnosis Date   Migraine without aura    Joint pain     knee, and lower leg   Sinusitis    Sprain of thoracic region     Thoracic region   Muscle spasms of lower extremity    Paresthesias 02/02/2014   Multiple sclerosis 03/2014    Past Surgical History  Procedure Laterality Date   No past surgeries      Family History  Problem Relation Age of Onset   Hypertension Sister     History  Substance Use Topics   Smoking status: Never Smoker    Smokeless tobacco: Never Used   Alcohol Use: Yes     Comment: occ 3-6 drinks per week    Allergies:  Allergies  Allergen Reactions   Amoxicillin Rash   Penicillins Hives and Rash    Prescriptions prior to admission  Medication Sig Dispense Refill Last Dose   Prenatal Vit-Fe Fumarate-FA (PRENATAL MULTIVITAMIN) TABS tablet Take 1 tablet by mouth at bedtime.   04/28/2015 at Unknown time   ranitidine (ZANTAC) 150 MG tablet Take 150 mg by mouth at bedtime.   04/28/2015 at Unknown time    Review of Systems  Constitutional: Negative for fever and malaise/fatigue.  Eyes: Negative for blurred vision.       Neg - floaters  Gastrointestinal: Negative for nausea, vomiting, abdominal pain,  diarrhea and constipation.  Genitourinary: Negative for dysuria, urgency and frequency.       Neg - vaginal bleeding, discharge, LOF  Neurological: Negative for headaches.   Physical Exam   Blood pressure 156/101, pulse 83, temperature 98.2 F (36.8 C), temperature source Oral, resp. rate 20.  Physical Exam  Nursing note and vitals reviewed. Constitutional: She is oriented to person, place, and time. She appears well-developed and well-nourished. No distress.  HENT:  Head: Normocephalic and atraumatic.  Cardiovascular: Normal rate.   Respiratory: Effort normal.  GI: Soft. She exhibits no distension and no mass. There is no tenderness. There is no rebound and no guarding.  Musculoskeletal: She exhibits no edema.  Neurological: She is alert and oriented to person, place, and time. She has normal reflexes.  No clonus  Skin: Skin is warm and dry. No erythema.  Psychiatric: She has a normal mood and affect.    Results for orders placed or performed during the hospital encounter of 04/29/15 (from the past 24 hour(s))  Protein / creatinine ratio, urine     Status: None   Collection Time: 04/29/15  3:35 PM  Result Value Ref Range   Creatinine, Urine 168.00 mg/dL   Total Protein, Urine 17 mg/dL   Protein  Creatinine Ratio 0.10 0.00 - 0.15 mg/mg[Cre]  CBC with Differential/Platelet     Status: Abnormal   Collection Time: 04/29/15  4:34 PM  Result Value Ref Range   WBC 12.3 (H) 4.0 - 10.5 K/uL   RBC 3.98 3.87 - 5.11 MIL/uL   Hemoglobin 13.5 12.0 - 15.0 g/dL   HCT 16.1 09.6 - 04.5 %   MCV 96.5 78.0 - 100.0 fL   MCH 33.9 26.0 - 34.0 pg   MCHC 35.2 30.0 - 36.0 g/dL   RDW 40.9 81.1 - 91.4 %   Platelets 180 150 - 400 K/uL   Neutrophils Relative % 82 (H) 43 - 77 %   Neutro Abs 10.2 (H) 1.7 - 7.7 K/uL   Lymphocytes Relative 11 (L) 12 - 46 %   Lymphs Abs 1.3 0.7 - 4.0 K/uL   Monocytes Relative 6 3 - 12 %   Monocytes Absolute 0.7 0.1 - 1.0 K/uL   Eosinophils Relative 1 0 - 5 %    Eosinophils Absolute 0.1 0.0 - 0.7 K/uL   Basophils Relative 0 0 - 1 %   Basophils Absolute 0.0 0.0 - 0.1 K/uL  Comprehensive metabolic panel     Status: Abnormal (Preliminary result)   Collection Time: 04/29/15  4:34 PM  Result Value Ref Range   Sodium 134 (L) 135 - 145 mmol/L   Potassium 4.5 3.5 - 5.1 mmol/L   Chloride 105 101 - 111 mmol/L   CO2 24 22 - 32 mmol/L   Glucose, Bld 75 65 - 99 mg/dL   BUN 11 6 - 20 mg/dL   Creatinine, Ser 7.82 0.44 - 1.00 mg/dL   Calcium PENDING 8.9 - 10.3 mg/dL   Total Protein 6.8 6.5 - 8.1 g/dL   Albumin 3.7 3.5 - 5.0 g/dL   AST 17 15 - 41 U/L   ALT 16 14 - 54 U/L   Alkaline Phosphatase 116 38 - 126 U/L   Total Bilirubin 0.7 0.3 - 1.2 mg/dL   GFR calc non Af Amer >60 >60 mL/min   GFR calc Af Amer >60 >60 mL/min   Anion gap 5 5 - 15  Uric acid     Status: None   Collection Time: 04/29/15  4:34 PM  Result Value Ref Range   Uric Acid, Serum 6.1 2.3 - 6.6 mg/dL  Lactate dehydrogenase     Status: None   Collection Time: 04/29/15  4:34 PM  Result Value Ref Range   LDH 163 98 - 192 U/L    Fetal Monitoring: Baseline: 120 bpm, moderate variability, + accelerations, no decelerations Contractions: None  Patient Vitals for the past 24 hrs:  BP Temp Temp src Pulse Resp  04/29/15 1721 (!) 156/101 mmHg - - 83 -  04/29/15 1711 159/94 mmHg - - 77 -  04/29/15 1701 146/98 mmHg - - 89 -  04/29/15 1651 146/92 mmHg - - 85 -  04/29/15 1641 154/99 mmHg - - 85 -  04/29/15 1631 (!) 150/105 mmHg - - 87 -  04/29/15 1621 154/95 mmHg - - 86 -  04/29/15 1614 153/100 mmHg - - 89 20  04/29/15 1613 153/100 mmHg - - 90 -  04/29/15 1530 160/96 mmHg 98.2 F (36.8 C) Oral 83 20    MAU Course  Procedures None  MDM UA, Urine protein/creatinine ratio, uric acid, LDH, CBC and CMP today Discussed patient with Dr. Henderson Cloud. He plans to admit for induction for gestational HTN  Assessment and Plan  A: [facility] at [redacted]w[redacted]d  Gestation HTN  P: Admit to L&D for induction of  labor  Marny Lowenstein, PA-C  04/29/2015, 5:46 PM

## 2015-04-29 NOTE — H&P (Signed)
Linda Griffith is a 29 y.o. female presenting for C/O mild HA and spots in vision this am followed by numbness and tingling in left hand for about 30 minutes. Now all of these symptoms are resolved and she feels fine. Pregnancy complicated by history of MS diagnosed about 2 years ago but not treatment required. Symptoms at diagnosis match symptoms she had today. Also history of migraine HA. Maternal Medical History:  Fetal activity: Perceived fetal activity is normal.      OB History    Gravida Para Term Preterm AB TAB SAB Ectopic Multiple Living   Past Medical History  Diagnosis Date   Migraine without aura    Joint pain     knee, and lower leg   Sinusitis    Sprain of thoracic region     Thoracic region   Muscle spasms of lower extremity    Paresthesias 02/02/2014   Multiple sclerosis 03/2014   Past Surgical History  Procedure Laterality Date   No past surgeries     Family History: family history includes Hypertension in her sister. Social History:  reports that she has never smoked. She has never used smokeless tobacco. She reports that she drinks alcohol. She reports that she does not use illicit drugs.   Prenatal Transfer Tool  Maternal Diabetes: No Genetic Screening: Normal Maternal Ultrasounds/Referrals: Normal Fetal Ultrasounds or other Referrals:  None Maternal Substance Abuse:  No Significant Maternal Medications:  None Significant Maternal Lab Results:  None Other Comments:  None  Review of Systems  HENT:       Mild HA earlier today for about 30 minutes, now resolved  Eyes:       Saw spots earlier today for about 30 minutes, now resolved  Gastrointestinal: Negative for abdominal pain.      Blood pressure 156/101, pulse 83, temperature 98.2 F (36.8 C), temperature source Oral, resp. rate 20. Maternal Exam:  Uterine Assessment: Contraction strength is mild.  Abdomen: Patient reports no abdominal tenderness.   Fetal  Exam Fetal State Assessment: Category I - tracings are normal.     Physical Exam  Constitutional: She is oriented to person, place, and time.  Eyes: EOM are normal. Pupils are equal, round, and reactive to light.  Cardiovascular: Normal rate and regular rhythm.   Respiratory: Effort normal and breath sounds normal.  GI: Soft. There is no tenderness.  Neurological: She is alert and oriented to person, place, and time. She has normal reflexes.  Face symmetric Grip strength 3+ equal DTR 2+ equal, no clonus    Prenatal labs: ABO, Rh:   Antibody:   Rubella:   RPR:    HBsAg:    HIV:    GBS:     Results for orders placed or performed during the hospital encounter of 04/29/15 (from the past 24 hour(s))  Protein / creatinine ratio, urine     Status: None   Collection Time: 04/29/15  3:35 PM  Result Value Ref Range   Creatinine, Urine 168.00 mg/dL   Total Protein, Urine 17 mg/dL   Protein Creatinine Ratio 0.10 0.00 - 0.15 mg/mg[Cre]  Urinalysis, Routine w reflex microscopic (not at [facility])     Status: Abnormal   Collection Time: 04/29/15  3:35 PM  Result Value Ref Range   Color, Urine YELLOW YELLOW   APPearance CLEAR CLEAR   Specific Gravity, Urine >1.030 (H) 1.005 - 1.030   pH 5.5  5.0 - 8.0   Glucose, UA NEGATIVE NEGATIVE mg/dL   Hgb urine dipstick NEGATIVE NEGATIVE   Bilirubin Urine NEGATIVE NEGATIVE   Ketones, ur 15 (A) NEGATIVE mg/dL   Protein, ur NEGATIVE NEGATIVE mg/dL   Urobilinogen, UA 0.2 0.0 - 1.0 mg/dL   Nitrite NEGATIVE NEGATIVE   Leukocytes, UA NEGATIVE NEGATIVE  CBC with Differential/Platelet     Status: Abnormal   Collection Time: 04/29/15  4:34 PM  Result Value Ref Range   WBC 12.3 (H) 4.0 - 10.5 K/uL   RBC 3.98 3.87 - 5.11 MIL/uL   Hemoglobin 13.5 12.0 - 15.0 g/dL   HCT 16.1 09.6 - 04.5 %   MCV 96.5 78.0 - 100.0 fL   MCH 33.9 26.0 - 34.0 pg   MCHC 35.2 30.0 - 36.0 g/dL   RDW 40.9 81.1 - 91.4 %   Platelets 180 150 - 400 K/uL   Neutrophils Relative % 82  (H) 43 - 77 %   Neutro Abs 10.2 (H) 1.7 - 7.7 K/uL   Lymphocytes Relative 11 (L) 12 - 46 %   Lymphs Abs 1.3 0.7 - 4.0 K/uL   Monocytes Relative 6 3 - 12 %   Monocytes Absolute 0.7 0.1 - 1.0 K/uL   Eosinophils Relative 1 0 - 5 %   Eosinophils Absolute 0.1 0.0 - 0.7 K/uL   Basophils Relative 0 0 - 1 %   Basophils Absolute 0.0 0.0 - 0.1 K/uL  Comprehensive metabolic panel     Status: Abnormal   Collection Time: 04/29/15  4:34 PM  Result Value Ref Range   Sodium 134 (L) 135 - 145 mmol/L   Potassium 4.5 3.5 - 5.1 mmol/L   Chloride 105 101 - 111 mmol/L   CO2 24 22 - 32 mmol/L   Glucose, Bld 75 65 - 99 mg/dL   BUN 11 6 - 20 mg/dL   Creatinine, Ser 7.82 0.44 - 1.00 mg/dL   Calcium 9.4 8.9 - 95.6 mg/dL   Total Protein 6.8 6.5 - 8.1 g/dL   Albumin 3.7 3.5 - 5.0 g/dL   AST 17 15 - 41 U/L   ALT 16 14 - 54 U/L   Alkaline Phosphatase 116 38 - 126 U/L   Total Bilirubin 0.7 0.3 - 1.2 mg/dL   GFR calc non Af Amer >60 >60 mL/min   GFR calc Af Amer >60 >60 mL/min   Anion gap 5 5 - 15  Uric acid     Status: None   Collection Time: 04/29/15  4:34 PM  Result Value Ref Range   Uric Acid, Serum 6.1 2.3 - 6.6 mg/dL  Lactate dehydrogenase     Status: None   Collection Time: 04/29/15  4:34 PM  Result Value Ref Range   LDH 163 98 - 192 U/L    Assessment/Plan: 29 yo G3P0 @ 39 2/7 weeks and elevated BP C/W preeclampsia D/W patient and her mother Recommend delivery D/W 2 stage induction and risks Will begin magnesium sulfate   Linda Griffith II,Linda Griffith E 04/29/2015, 5:56 PM

## 2015-04-30 ENCOUNTER — Inpatient Hospital Stay (HOSPITAL_COMMUNITY): Payer: PRIVATE HEALTH INSURANCE | Admitting: Anesthesiology

## 2015-04-30 ENCOUNTER — Encounter (HOSPITAL_COMMUNITY): Payer: Self-pay | Admitting: Anesthesiology

## 2015-04-30 LAB — CBC
HCT: 39.4 % (ref 36.0–46.0)
HEMATOCRIT: 36.6 % (ref 36.0–46.0)
HEMOGLOBIN: 14.1 g/dL (ref 12.0–15.0)
HEMOGLOBIN: 12.9 g/dL (ref 12.0–15.0)
MCH: 34.1 pg — AB (ref 26.0–34.0)
MCH: 33.6 pg (ref 26.0–34.0)
MCHC: 35.8 g/dL (ref 30.0–36.0)
MCHC: 35.2 g/dL (ref 30.0–36.0)
MCV: 95.4 fL (ref 78.0–100.0)
MCV: 95.3 fL (ref 78.0–100.0)
PLATELETS: 180 10*3/uL (ref 150–400)
Platelets: 187 10*3/uL (ref 150–400)
RBC: 4.13 MIL/uL (ref 3.87–5.11)
RBC: 3.84 MIL/uL — AB (ref 3.87–5.11)
RDW: 12.8 % (ref 11.5–15.5)
RDW: 12.8 % (ref 11.5–15.5)
WBC: 12 10*3/uL — ABNORMAL HIGH (ref 4.0–10.5)
WBC: 15.7 10*3/uL — ABNORMAL HIGH (ref 4.0–10.5)

## 2015-04-30 LAB — COMPREHENSIVE METABOLIC PANEL
ALT: 15 U/L (ref 14–54)
AST: 23 U/L (ref 15–41)
Albumin: 3.2 g/dL — ABNORMAL LOW (ref 3.5–5.0)
Alkaline Phosphatase: 97 U/L (ref 38–126)
Anion gap: 6 (ref 5–15)
BUN: 8 mg/dL (ref 6–20)
CALCIUM: 7.4 mg/dL — AB (ref 8.9–10.3)
CO2: 20 mmol/L — ABNORMAL LOW (ref 22–32)
CREATININE: 0.71 mg/dL (ref 0.44–1.00)
Chloride: 105 mmol/L (ref 101–111)
GFR calc non Af Amer: >60 mL/min (ref >60)
GLUCOSE: 123 mg/dL — AB (ref 65–99)
Potassium: 3.6 mmol/L (ref 3.5–5.1)
SODIUM: 131 mmol/L — AB (ref 135–145)
TOTAL PROTEIN: 6.4 g/dL — AB (ref 6.5–8.1)
Total Bilirubin: 0.8 mg/dL (ref 0.3–1.2)

## 2015-04-30 LAB — RPR: RPR: NONREACTIVE

## 2015-04-30 LAB — MRSA PCR SCREENING: MRSA by PCR: NEGATIVE

## 2015-04-30 LAB — URIC ACID: Uric Acid, Serum: 6.2 mg/dL (ref 2.3–6.6)

## 2015-04-30 MED ORDER — SENNOSIDES-DOCUSATE SODIUM 8.6-50 MG PO TABS
2.0000 | ORAL_TABLET | ORAL | Status: DC
  Administered 2015-05-01: 2 via ORAL
  Filled 2015-04-30 (×2): qty 2

## 2015-04-30 MED ORDER — BENZOCAINE-MENTHOL 20-0.5 % EX AERO
1.0000 "application " | INHALATION_SPRAY | CUTANEOUS | Status: DC | PRN
  Administered 2015-05-01: 1 via TOPICAL
  Filled 2015-04-30 (×2): qty 56

## 2015-04-30 MED ORDER — TERBUTALINE SULFATE 1 MG/ML IJ SOLN: 0.2500 mg | Freq: Once | INTRAMUSCULAR | Status: DC | PRN

## 2015-04-30 MED ORDER — OXYCODONE-ACETAMINOPHEN 5-325 MG PO TABS: 2.0000 | ORAL_TABLET | ORAL | Status: DC | PRN

## 2015-04-30 MED ORDER — ACETAMINOPHEN 325 MG PO TABS: 650.0000 mg | ORAL_TABLET | ORAL | Status: DC | PRN

## 2015-04-30 MED ORDER — EPHEDRINE 5 MG/ML INJ
10.0000 mg | INTRAVENOUS | Status: DC | PRN
  Filled 2015-04-30: qty 2

## 2015-04-30 MED ORDER — DIPHENHYDRAMINE HCL 25 MG PO CAPS: 25.0000 mg | ORAL_CAPSULE | Freq: Four times a day (QID) | ORAL | Status: DC | PRN

## 2015-04-30 MED ORDER — MAGNESIUM SULFATE 50 % IJ SOLN
2.0000 g/h | INTRAVENOUS | Status: DC
  Administered 2015-04-30: 2 g/h via INTRAVENOUS
  Filled 2015-04-30 (×2): qty 80

## 2015-04-30 MED ORDER — WITCH HAZEL-GLYCERIN EX PADS: 1.0000 "application " | MEDICATED_PAD | CUTANEOUS | Status: DC | PRN

## 2015-04-30 MED ORDER — SIMETHICONE 80 MG PO CHEW
80.0000 mg | CHEWABLE_TABLET | ORAL | Status: DC | PRN
  Filled 2015-04-30: qty 1

## 2015-04-30 MED ORDER — DIBUCAINE 1 % RE OINT: 1.0000 "application " | TOPICAL_OINTMENT | RECTAL | Status: DC | PRN

## 2015-04-30 MED ORDER — DIPHENHYDRAMINE HCL 50 MG/ML IJ SOLN: 12.5000 mg | INTRAMUSCULAR | Status: DC | PRN

## 2015-04-30 MED ORDER — ZOLPIDEM TARTRATE 5 MG PO TABS: 5.0000 mg | ORAL_TABLET | Freq: Every evening | ORAL | Status: DC | PRN

## 2015-04-30 MED ORDER — LIDOCAINE HCL (PF) 1 % IJ SOLN
INTRAMUSCULAR | Status: DC | PRN
  Administered 2015-04-30 (×2): 8 mL via EPIDURAL

## 2015-04-30 MED ORDER — OXYCODONE-ACETAMINOPHEN 5-325 MG PO TABS: 1.0000 | ORAL_TABLET | ORAL | Status: DC | PRN

## 2015-04-30 MED ORDER — PHENYLEPHRINE 40 MCG/ML (10ML) SYRINGE FOR IV PUSH (FOR BLOOD PRESSURE SUPPORT)
80.0000 ug | PREFILLED_SYRINGE | INTRAVENOUS | Status: DC | PRN
  Filled 2015-04-30: qty 2
  Filled 2015-04-30: qty 20

## 2015-04-30 MED ORDER — FENTANYL 2.5 MCG/ML BUPIVACAINE 1/10 % EPIDURAL INFUSION (WH - ANES)
14.0000 mL/h | INTRAMUSCULAR | Status: DC | PRN
  Administered 2015-04-30: 14 mL/h via EPIDURAL
  Filled 2015-04-30: qty 125

## 2015-04-30 MED ORDER — ONDANSETRON HCL 4 MG PO TABS: 4.0000 mg | ORAL_TABLET | ORAL | Status: DC | PRN

## 2015-04-30 MED ORDER — IBUPROFEN 600 MG PO TABS
600.0000 mg | ORAL_TABLET | Freq: Four times a day (QID) | ORAL | Status: DC
  Administered 2015-04-30 – 2015-05-02 (×9): 600 mg via ORAL
  Filled 2015-04-30 (×9): qty 1

## 2015-04-30 MED ORDER — FLEET ENEMA 7-19 GM/118ML RE ENEM: 1.0000 | ENEMA | Freq: Every day | RECTAL | Status: DC | PRN

## 2015-04-30 MED ORDER — LANOLIN HYDROUS EX OINT: TOPICAL_OINTMENT | CUTANEOUS | Status: DC | PRN

## 2015-04-30 MED ORDER — TETANUS-DIPHTH-ACELL PERTUSSIS 5-2.5-18.5 LF-MCG/0.5 IM SUSP
0.5000 mL | Freq: Once | INTRAMUSCULAR | Status: DC
  Filled 2015-04-30: qty 0.5

## 2015-04-30 MED ORDER — BISACODYL 10 MG RE SUPP: 10.0000 mg | Freq: Every day | RECTAL | Status: DC | PRN

## 2015-04-30 MED ORDER — ONDANSETRON HCL 4 MG/2ML IJ SOLN: 4.0000 mg | INTRAMUSCULAR | Status: DC | PRN

## 2015-04-30 MED ORDER — LACTATED RINGERS IV SOLN
INTRAVENOUS | Status: DC
  Administered 2015-04-30 (×2): via INTRAVENOUS

## 2015-04-30 MED ORDER — OXYTOCIN 40 UNITS IN LACTATED RINGERS INFUSION - SIMPLE MED
1.0000 m[IU]/min | INTRAVENOUS | Status: DC
Start: 2015-04-30 — End: 2015-04-30
  Administered 2015-04-30: 2 m[IU]/min via INTRAVENOUS
  Filled 2015-04-30: qty 1000

## 2015-04-30 MED ORDER — PRENATAL MULTIVITAMIN CH
1.0000 | ORAL_TABLET | Freq: Every day | ORAL | Status: DC
  Administered 2015-04-30 – 2015-05-02 (×3): 1 via ORAL
  Filled 2015-04-30 (×3): qty 1

## 2015-04-30 NOTE — Progress Notes (Signed)
Delivery Note At 5:44 AM a viable female was delivered via  (Presentation: ;  ).  APGAR: 8, 9; weight  .   Placenta status: Intact, Spontaneous.  Cord:  with the following complications: .  Cord pH: pending  Anesthesia:   Episiotomy:   Lacerations: second degree midline laceration  Suture Repair: 3.0 vicryl rapide Est. Blood Loss (mL):  350  Mom to postpartum.  Baby to Couplet care / Skin to Skin.  Aaliyan Brinkmeier II,Keymoni Mccaster E 04/30/2015, 5:59 AM

## 2015-04-30 NOTE — Lactation Note (Signed)
This note was copied from the chart of Linda Griffith. Lactation Consultation Note  Patient Name: Linda Griffith [id number] Date: 04/30/2015 Reason for consult: Initial assessment Mother is AICU on Magnesium Sulfate. Mother and FOB are alert and receptive to breastfeeding teaching. Baby is 6 hours old. Mother assisted in the cross cradle position and "Becham" latched well and suckled ina strong, rhythmic pattern for 2-3 minutes before falling asleep. Discussed signs of a good latch and pillow support. Father attentive and supportive especially since mother may become tired due to magnesium and breastfeeding. Hand expression was taught and mother was able to express colostrum with ease. She returned the demonstration. Mother reports baby had a very good feeding at 10:00am. Mom made aware of O/P services, breastfeeding support groups, community resources, and our phone # for post-discharge questions. Lactation will follow. Mother instructed to ask nurses for help as needed.   Maternal Data Formula Feeding for Exclusion: No Has patient been taught Hand Expression?: Yes (B. Paxson Harrower,RN, IBCLC) Does the patient have breastfeeding experience prior to this delivery?: No  Feeding Feeding Type: Breast Fed Length of feed: 3 min (suckled well briefly, then fell asleep)  LATCH Score/Interventions Latch: Grasps breast easily, tongue down, lips flanged, rhythmical sucking.  Audible Swallowing: A few with stimulation Intervention(s): Skin to skin;Hand expression;Alternate breast massage  Type of Nipple: Everted at rest and after stimulation  Comfort (Breast/Nipple): Soft / non-tender     Hold (Positioning): Assistance needed to correctly position infant at breast and maintain latch.  LATCH Score: 8  Lactation Tools Discussed/Used WIC Program: No   Consult Status Consult Status: Follow-up Date: 05/01/15 Follow-up type: In-patient    Christella Hartigan M 04/30/2015, 12:05 PM

## 2015-04-30 NOTE — Anesthesia Preprocedure Evaluation (Signed)
Anesthesia Evaluation  Patient identified by MRN, date of birth, ID band Patient awake    Reviewed: Allergy & Precautions, H&P , NPO status , Patient's Chart, lab work & pertinent test results  Airway Mallampati: II  TM Distance: >3 FB Neck ROM: full    Dental no notable dental hx.    Pulmonary neg pulmonary ROS,    Pulmonary exam normal       Cardiovascular negative cardio ROS Normal cardiovascular exam    Neuro/Psych negative psych ROS   GI/Hepatic negative GI ROS, Neg liver ROS,   Endo/Other  negative endocrine ROS  Renal/GU negative Renal ROS     Musculoskeletal   Abdominal (+) + obese,   Peds  Hematology negative hematology ROS (+)   Anesthesia Other Findings   Reproductive/Obstetrics (+) Pregnancy                             Anesthesia Physical Anesthesia Plan  ASA: II  Anesthesia Plan: Epidural   Post-op Pain Management:    Induction:   Airway Management Planned:   Additional Equipment:   Intra-op Plan:   Post-operative Plan:   Informed Consent: I have reviewed the patients History and Physical, chart, labs and discussed the procedure including the risks, benefits and alternatives for the proposed anesthesia with the patient or authorized representative who has indicated his/her understanding and acceptance.     Plan Discussed with:   Anesthesia Plan Comments:         Anesthesia Quick Evaluation

## 2015-04-30 NOTE — Progress Notes (Signed)
No C/O, eating supper  BP 143/85 mmHg   Pulse 97   Temp(Src) 97.7 F (36.5 C) (Oral)   Resp 18   Ht 5\' 9"  (1.753 m)   Wt 226 lb (102.513 kg)   BMI 33.36 kg/m2   SpO2 98%   Breastfeeding? Unknown  UO good  Labs OK  A/P: Stable         D/W patient will stop magnesium in am         Probable D/C Monday

## 2015-04-30 NOTE — Progress Notes (Deleted)
UR chart review completed. ° °

## 2015-04-30 NOTE — Anesthesia Procedure Notes (Signed)
Epidural Patient location during procedure: OB Start time: 04/30/2015 3:09 AM End time: 04/30/2015 3:13 AM  Staffing Anesthesiologist: Leilani Able Performed by: anesthesiologist   Preanesthetic Checklist Completed: patient identified, surgical consent, pre-op evaluation, timeout performed, IV checked, risks and benefits discussed and monitors and equipment checked  Epidural Patient position: sitting Prep: site prepped and draped and DuraPrep Patient monitoring: continuous pulse ox and blood pressure Approach: midline Location: L3-L4 Injection technique: LOR air  Needle:  Needle type: Tuohy  Needle gauge: 17 G Needle length: 9 cm and 9 Needle insertion depth: 6 cm Catheter type: closed end flexible Catheter size: 19 Gauge Catheter at skin depth: 12 cm Test dose: negative and Other  Assessment Sensory level: T9 Events: blood not aspirated, injection not painful, no injection resistance, negative IV test and no paresthesia  Additional Notes Reason for block:procedure for pain

## 2015-04-30 NOTE — Progress Notes (Signed)
UR chart review completed. ° °

## 2015-05-01 LAB — CBC
HEMATOCRIT: 30.9 % — AB (ref 36.0–46.0)
HEMOGLOBIN: 10.6 g/dL — AB (ref 12.0–15.0)
MCH: 33.3 pg (ref 26.0–34.0)
MCHC: 34.3 g/dL (ref 30.0–36.0)
MCV: 97.2 fL (ref 78.0–100.0)
Platelets: 166 10*3/uL (ref 150–400)
RBC: 3.18 MIL/uL — AB (ref 3.87–5.11)
RDW: 13.1 % (ref 11.5–15.5)
WBC: 10.7 10*3/uL — ABNORMAL HIGH (ref 4.0–10.5)

## 2015-05-01 NOTE — Progress Notes (Signed)
PPD #1 No HA, no vision change, no numbness  BPs improving Filed Vitals:   05/01/15 0800  BP: 114/73  Pulse: 95  Temp:   Resp:   DTR 2+ UO good Labs OK  A/P: D/C magnesium sulfate         Transfer to MB room

## 2015-05-01 NOTE — Anesthesia Postprocedure Evaluation (Signed)
°  Anesthesia Post-op Note  Patient: Linda Griffith  Procedure(s) Performed: * No procedures listed *  Patient Location: PACU and Mother/Baby  Anesthesia Type:Epidural  Level of Consciousness: awake, alert  and oriented  Airway and Oxygen Therapy: Patient Spontanous Breathing  Post-op Pain: none  Post-op Assessment: Post-op Vital signs reviewed, Patient's Cardiovascular Status Stable, No headache, No backache, No residual numbness and No residual motor weakness  Post-op Vital Signs: Reviewed and stable  Complications: No apparent anesthesia complications

## 2015-05-01 NOTE — Lactation Note (Signed)
This note was copied from the chart of Linda Griffith. Lactation Consultation Note  Patient Name: Linda Temprence Body [id number] Date: 05/01/2015 Reason for consult: Follow-up assessment;Infant < 6lbs  Visited with Mom and FOB, baby 74 hrs old.  Mom in AICU, MgSO4 dc'd and about to be transferred to [facility].  Baby circumcised this am. Baby's weight under 6 lbs this am, full term SGA.  Baby had not fed well during the night per FOB.  Mom latched baby in cradle hold when entered the room.  Encouraged Mom to undress baby to provide skin to skin stimulation while breast feeding.  Demonstrated and assisted with switching Mom's hands to better support breast using a cross cradle hold.  Baby very actively breast feeding.  Taught Mom how to listen for swallowing.  Encouraged her to use alternating breast compression, demonstrated this.  Instructed her to take baby off when swallowing stops, or after about 20 minutes, and burp and switch onto 2nd breast.  Mom denies feeling any pinching or pain, describes feeling a tug on the breast.   Discussed with parents the benefit of adding some pumping after breast feeding, and offering baby some expressed colostrum by syringe or dropper.  Parents are agreeable to this.  To ask for DEBP when moved to 114.    Maternal Data    Feeding Feeding Type: Breast Fed  LATCH Score/Interventions Latch: Grasps breast easily, tongue down, lips flanged, rhythmical sucking.  Audible Swallowing: Spontaneous and intermittent Intervention(s): Alternate breast massage;Hand expression;Skin to skin  Type of Nipple: Everted at rest and after stimulation  Comfort (Breast/Nipple): Soft / non-tender     Hold (Positioning): Assistance needed to correctly position infant at breast and maintain latch. Intervention(s): Breastfeeding basics reviewed;Support Pillows;Position options;Skin to skin  LATCH Score: 9  Lactation Tools Discussed/Used     Consult Status Consult Status:  Follow-up Date: 05/02/15 Follow-up type: In-patient    Judee Clara 05/01/2015, 12:46 PM

## 2015-05-02 MED ORDER — IBUPROFEN 600 MG PO TABS: 600.0000 mg | ORAL_TABLET | Freq: Four times a day (QID) | ORAL | Status: DC

## 2015-05-02 NOTE — Lactation Note (Signed)
This note was copied from the chart of Linda Griffith. Lactation Consultation Note Mom has a 5'l3oz baby who is BF well. Has better output than is documented. Mom denies painful latches. Discussed engorgement, I&O, supply and demand, let down, clog ducts, and support groups. Encouraged mom to assess breast before and after BF. Asked several questions.  Patient Name: Linda Columbine Acre [id number] Date: 05/02/2015 Reason for consult: Follow-up assessment;Infant < 6lbs   Maternal Data    Feeding Length of feed: 30 min  LATCH Score/Interventions Latch: Grasps breast easily, tongue down, lips flanged, rhythmical sucking.  Audible Swallowing: Spontaneous and intermittent  Type of Nipple: Everted at rest and after stimulation  Comfort (Breast/Nipple): Soft / non-tender     Hold (Positioning): Assistance needed to correctly position infant at breast and maintain latch.  LATCH Score: 9  Lactation Tools Discussed/Used     Consult Status Consult Status: Complete Date: 05/02/15    Charyl Dancer 05/02/2015, 12:17 PM

## 2015-05-02 NOTE — Discharge Summary (Signed)
Obstetric Discharge Summary Reason for Admission: induction of labor Prenatal Procedures: ultrasound Intrapartum Procedures: spontaneous vaginal delivery Postpartum Procedures: none Complications-Operative and Postpartum: 2 degree perineal laceration HEMOGLOBIN  Date Value Ref Range Status  05/01/2015 10.6* 12.0 - 15.0 g/dL Final   HCT  Date Value Ref Range Status  05/01/2015 30.9* 36.0 - 46.0 % Final    Physical Exam:  General: alert and cooperative Lochia: appropriate Uterine Fundus: firm Incision: healing well DVT Evaluation: No evidence of DVT seen on physical exam. Negative Homan's sign. No cords or calf tenderness. Calf/Ankle edema is present.  Discharge Diagnoses: Term Pregnancy-delivered  Discharge Information: Date: 05/02/2015 Activity: pelvic rest Diet: routine Medications: PNV and Ibuprofen Condition: stable Instructions: refer to practice specific booklet Discharge to: home, discussed signs and symptoms of PIH   Newborn Data: Live born female  Birth Weight: 6 lb 4.2 oz (2841 g) APGAR: 8, 9  Home with mother.  CURTIS,CAROL G 05/02/2015, 8:09 AM

## 2015-06-07 ENCOUNTER — Other Ambulatory Visit: Payer: Self-pay | Admitting: Obstetrics & Gynecology

## 2015-06-08 LAB — CYTOLOGY - PAP

## 2018-10-01 NOTE — L&D Delivery Note (Signed)
Delivery Note  SVD viable female Apgars 8,9 over small post vaginal lac.  Placenta delivered spontaneously intact with 3VC. Repair with 2-0 Chromic with good support and hemostasis noted.  R/V exam confirms.  PH art was sent.   Mother and baby to couplet care and are doing well.  EBL 100cc  Louretta Shorten, MD

## 2018-11-30 LAB — OB RESULTS CONSOLE GC/CHLAMYDIA
Chlamydia: NEGATIVE
Gonorrhea: NEGATIVE

## 2018-11-30 LAB — OB RESULTS CONSOLE RPR: RPR: NONREACTIVE

## 2018-11-30 LAB — OB RESULTS CONSOLE HEPATITIS B SURFACE ANTIGEN: Hepatitis B Surface Ag: NEGATIVE

## 2018-11-30 LAB — OB RESULTS CONSOLE HIV ANTIBODY (ROUTINE TESTING): HIV: NONREACTIVE

## 2018-11-30 LAB — OB RESULTS CONSOLE RUBELLA ANTIBODY, IGM: Rubella: IMMUNE

## 2018-11-30 LAB — OB RESULTS CONSOLE ABO/RH: RH Type: POSITIVE

## 2018-12-18 ENCOUNTER — Encounter (HOSPITAL_COMMUNITY): Payer: Self-pay | Admitting: *Deleted

## 2018-12-18 ENCOUNTER — Inpatient Hospital Stay (HOSPITAL_COMMUNITY)
Admission: AD | Admit: 2018-12-18 | Discharge: 2018-12-18 | Disposition: A | Discharge status: Home / Self Care | Payer: BLUE CROSS/BLUE SHIELD | Attending: Obstetrics and Gynecology | Admitting: Obstetrics and Gynecology

## 2018-12-18 ENCOUNTER — Other Ambulatory Visit: Payer: Self-pay

## 2018-12-18 DIAGNOSIS — Z791 Long term (current) use of non-steroidal anti-inflammatories (NSAID): Secondary | ICD-10-CM | POA: Diagnosis not present

## 2018-12-18 DIAGNOSIS — Z3A01 Less than 8 weeks gestation of pregnancy: Secondary | ICD-10-CM

## 2018-12-18 DIAGNOSIS — O219 Vomiting of pregnancy, unspecified: Secondary | ICD-10-CM

## 2018-12-18 DIAGNOSIS — G35 Multiple sclerosis: Secondary | ICD-10-CM | POA: Diagnosis not present

## 2018-12-18 DIAGNOSIS — O9989 Other specified diseases and conditions complicating pregnancy, childbirth and the puerperium: Secondary | ICD-10-CM | POA: Insufficient documentation

## 2018-12-18 DIAGNOSIS — Z880 Allergy status to penicillin: Secondary | ICD-10-CM | POA: Insufficient documentation

## 2018-12-18 DIAGNOSIS — R197 Diarrhea, unspecified: Secondary | ICD-10-CM | POA: Insufficient documentation

## 2018-12-18 LAB — URINALYSIS, ROUTINE W REFLEX MICROSCOPIC
Bilirubin Urine: NEGATIVE
Glucose, UA: NEGATIVE mg/dL
Hgb urine dipstick: NEGATIVE
Ketones, ur: 20 mg/dL — AB
Leukocytes,Ua: NEGATIVE
Nitrite: NEGATIVE
Protein, ur: 30 mg/dL — AB
Specific Gravity, Urine: 1.027 (ref 1.005–1.030)
pH: 5 (ref 5.0–8.0)

## 2018-12-18 LAB — POCT PREGNANCY, URINE: Preg Test, Ur: POSITIVE — AB

## 2018-12-18 MED ORDER — RANITIDINE HCL 150 MG PO TABS: 150.0000 mg | ORAL_TABLET | Freq: Two times a day (BID) | ORAL | 1 refills | Status: DC

## 2018-12-18 MED ORDER — METOCLOPRAMIDE HCL 5 MG/ML IJ SOLN
10.0000 mg | Freq: Once | INTRAMUSCULAR | Status: AC
  Administered 2018-12-18: 10 mg via INTRAVENOUS
  Filled 2018-12-18: qty 2

## 2018-12-18 MED ORDER — SCOPOLAMINE 1 MG/3DAYS TD PT72: 1.0000 | MEDICATED_PATCH | TRANSDERMAL | 1 refills | Status: DC

## 2018-12-18 MED ORDER — METOCLOPRAMIDE HCL 10 MG PO TABS: 10.0000 mg | ORAL_TABLET | Freq: Three times a day (TID) | ORAL | 1 refills | Status: DC

## 2018-12-18 MED ORDER — SCOPOLAMINE 1 MG/3DAYS TD PT72
1.0000 | MEDICATED_PATCH | TRANSDERMAL | Status: DC
  Administered 2018-12-18: 1.5 mg via TRANSDERMAL
  Filled 2018-12-18: qty 1

## 2018-12-18 MED ORDER — FAMOTIDINE IN NACL 20-0.9 MG/50ML-% IV SOLN: 20.0000 mg | Freq: Once | INTRAVENOUS | Status: DC

## 2018-12-18 MED ORDER — FAMOTIDINE 20 MG IN NS 100 ML IVPB
20.0000 mg | Freq: Once | INTRAVENOUS | Status: AC
  Administered 2018-12-18: 20 mg via INTRAVENOUS
  Filled 2018-12-18: qty 100

## 2018-12-18 MED ORDER — LACTATED RINGERS IV BOLUS
1000.0000 mL | Freq: Once | INTRAVENOUS | Status: AC
  Administered 2018-12-18: 1000 mL via INTRAVENOUS

## 2018-12-18 MED ORDER — DEXTROSE 5 % IN LACTATED RINGERS IV BOLUS
1000.0000 mL | Freq: Once | INTRAVENOUS | Status: AC
  Administered 2018-12-18: 1000 mL via INTRAVENOUS

## 2018-12-18 NOTE — Discharge Instructions (Signed)
Morning Sickness    Morning sickness is when you feel sick to your stomach (nauseous) during pregnancy. You may feel sick to your stomach and throw up (vomit). You may feel sick in the morning, but you can feel this way at any time of day. Some women feel very sick to their stomach and cannot stop throwing up (hyperemesis gravidarum).  Follow these instructions at home:  Medicines   Take over-the-counter and prescription medicines only as told by your doctor. Do not take any medicines until you talk with your doctor about them first.   Taking multivitamins before getting pregnant can stop or lessen the harshness of morning sickness.  Eating and drinking   Eat dry toast or crackers before getting out of bed.   Eat 5 or 6 small meals a day.   Eat dry and bland foods like rice and baked potatoes.   Do not eat greasy, fatty, or spicy foods.   Have someone cook for you if the smell of food causes you to feel sick or throw up.   If you feel sick to your stomach after taking prenatal vitamins, take them at night or with a snack.   Eat protein when you need a snack. Nuts, yogurt, and cheese are good choices.   Drink fluids throughout the day.   Try ginger ale made with real ginger, ginger tea made from fresh grated ginger, or ginger candies.  General instructions   Do not use any products that have nicotine or tobacco in them, such as cigarettes and e-cigarettes. If you need help quitting, ask your doctor.   Use an air purifier to keep the air in your house free of smells.   Get lots of fresh air.   Try to avoid smells that make you feel sick.   Try:  ? Wearing a bracelet that is used for seasickness (acupressure wristband).  ? Going to a doctor who puts thin needles into certain body points (acupuncture) to improve how you feel.  Contact a doctor if:   You need medicine to feel better.   You feel dizzy or light-headed.   You are losing weight.  Get help right away if:   You feel very sick to your  stomach and cannot stop throwing up.   You pass out (faint).   You have very bad pain in your belly.  Summary   Morning sickness is when you feel sick to your stomach (nauseous) during pregnancy.   You may feel sick in the morning, but you can feel this way at any time of day.   Making some changes to what you eat may help your symptoms go away.  This information is not intended to replace advice given to you by your health care provider. Make sure you discuss any questions you have with your health care provider.  Document Released: 10/25/2004 Document Revised: 10/18/2016 Document Reviewed: 10/18/2016  Elsevier Interactive Patient Education  2019 [employer] Inc[employer]

## 2018-12-18 NOTE — MAU Provider Note (Signed)
History     CSN: [id number]  Arrival date and time: 12/18/18 1427   First Provider Initiated Contact with Patient 12/18/18 1607      Chief Complaint  Patient presents with  . Emesis  . Nausea  . Diarrhea   HPI   [name] is a 33 y.o. female G3P1011 @ [redacted]w[redacted]d here in MAU with complaints of Nausea/and vomiting. She attests to 3 episodes of diarrhea yesterday only, none today. She has had the N/V since she found out she was pregnant. She is currently taking unisom and B6; she was given bonjesta however could not fill it due to cost. She says she is vomiting at least 20x per day. No sick contacts. Had similar symptoms with previous pregnancy and had to come to MAU for IV fluids.   OB History    Gravida  3   Para  1   Term  1   Preterm      AB  1   Living  1     SAB  1   TAB      Ectopic      Multiple  0   Live Births  1           Past Medical History:  Diagnosis Date  . Joint pain    knee, and lower leg  . Migraine without aura   . Multiple sclerosis (HCC) 03/2014  . Muscle spasms of lower extremity   . Paresthesias 02/02/2014  . Sinusitis   . Sprain of thoracic region    Thoracic region    Past Surgical History:  Procedure Laterality Date  . NO PAST SURGERIES      Family History  Problem Relation Age of Onset  . Hypertension Sister     Social History   Tobacco Use  . Smoking status: Never Smoker  . Smokeless tobacco: Never Used  Substance Use Topics  . Alcohol use: Not Currently    Comment: occ 3-6 drinks per week  . Drug use: No    Allergies:  Allergies  Allergen Reactions  . Amoxicillin Rash  . Penicillins Hives and Rash    Medications Prior to Admission  Medication Sig Dispense Refill Last Dose  . doxylamine, Sleep, (UNISOM) 25 MG tablet Take 25 mg by mouth at bedtime as needed for sleep.   12/18/2018 at Unknown time  . Prenatal Vit-Fe Fumarate-FA (PRENATAL MULTIVITAMIN) TABS tablet Take 1 tablet by mouth at bedtime.    12/18/2018 at Unknown time  . pyridOXINE (VITAMIN B-6) 50 MG tablet Take 50 mg by mouth daily.   12/18/2018 at Unknown time  . ibuprofen (ADVIL,MOTRIN) 600 MG tablet Take 1 tablet (600 mg total) by mouth every 6 (six) hours. 30 tablet 1    Results for orders placed or performed during the hospital encounter of 12/18/18 (from the past 48 hour(s))  Pregnancy, urine POC     Status: Abnormal   Collection Time: 12/18/18  3:12 PM  Result Value Ref Range   Preg Test, Ur POSITIVE (A) NEGATIVE    Comment:        THE SENSITIVITY OF THIS METHODOLOGY IS >24 mIU/mL    Review of Systems  Constitutional: Negative for fever.  Gastrointestinal: Positive for nausea and vomiting. Negative for abdominal pain.  Genitourinary: Negative for vaginal bleeding.   Physical Exam   Blood pressure 130/73, pulse 96, temperature 98.7 F (37.1 C), temperature source Oral, resp. rate 16, height 5\' 9"  (1.753 m), weight 110.2 kg, last  menstrual period 10/24/2018, SpO2 99 %, unknown if currently breastfeeding.  Physical Exam  Constitutional: She is oriented to person, place, and time. She appears well-developed and well-nourished. She has a sickly appearance. No distress.  HENT:  Head: Normocephalic.  Musculoskeletal: Normal range of motion.  Neurological: She is alert and oriented to person, place, and time.  Skin: Skin is warm. She is not diaphoretic.  Psychiatric: Her behavior is normal.    MAU Course  Procedures  None  MDM  Urine was done and faxed to [place]: 20 ketones noted with 1027 specific gravity.  LR bolus x1 D5LR bolus x1 Pepcid 20 mg Iv x1 Reglan 10 mg Iv Patient was given a PO challenge and passed. She says she is feeling much better Scopolamine patch placed   Assessment and Plan   A:  1. Nausea and vomiting in pregnancy   2. [redacted] weeks gestation of pregnancy     P:  Discharge home in stable condition Rx: scopolamine, Pepcid, Reglan  F/u with OB provider as scheduled or sooner if  needed Return to MAU if symptoms worsen Small, frequent meals Bland diet     Ryzen Deady, Harolyn Rutherford, NP 12/18/2018 7:56 PM

## 2018-12-18 NOTE — MAU Note (Signed)
Pt has had N/V for several weeks had diarrhea yesterday none today. Taking Unisom and B6 for nausea without relief.

## 2018-12-26 ENCOUNTER — Inpatient Hospital Stay (HOSPITAL_COMMUNITY)
Admission: AD | Admit: 2018-12-26 | Discharge: 2018-12-26 | Disposition: A | Discharge status: Home / Self Care | Payer: BLUE CROSS/BLUE SHIELD | Attending: Obstetrics and Gynecology | Admitting: Obstetrics and Gynecology

## 2018-12-26 ENCOUNTER — Encounter (HOSPITAL_COMMUNITY): Payer: Self-pay | Admitting: *Deleted

## 2018-12-26 ENCOUNTER — Other Ambulatory Visit: Payer: Self-pay

## 2018-12-26 DIAGNOSIS — G35 Multiple sclerosis: Secondary | ICD-10-CM | POA: Diagnosis not present

## 2018-12-26 DIAGNOSIS — Z79899 Other long term (current) drug therapy: Secondary | ICD-10-CM | POA: Insufficient documentation

## 2018-12-26 DIAGNOSIS — R112 Nausea with vomiting, unspecified: Secondary | ICD-10-CM | POA: Diagnosis not present

## 2018-12-26 DIAGNOSIS — H538 Other visual disturbances: Secondary | ICD-10-CM | POA: Diagnosis not present

## 2018-12-26 DIAGNOSIS — Z8249 Family history of ischemic heart disease and other diseases of the circulatory system: Secondary | ICD-10-CM | POA: Diagnosis not present

## 2018-12-26 DIAGNOSIS — M2550 Pain in unspecified joint: Secondary | ICD-10-CM | POA: Insufficient documentation

## 2018-12-26 DIAGNOSIS — Z3A09 9 weeks gestation of pregnancy: Secondary | ICD-10-CM | POA: Insufficient documentation

## 2018-12-26 DIAGNOSIS — O99351 Diseases of the nervous system complicating pregnancy, first trimester: Secondary | ICD-10-CM | POA: Diagnosis not present

## 2018-12-26 DIAGNOSIS — O219 Vomiting of pregnancy, unspecified: Secondary | ICD-10-CM

## 2018-12-26 DIAGNOSIS — O26891 Other specified pregnancy related conditions, first trimester: Secondary | ICD-10-CM | POA: Diagnosis not present

## 2018-12-26 DIAGNOSIS — O9989 Other specified diseases and conditions complicating pregnancy, childbirth and the puerperium: Secondary | ICD-10-CM | POA: Diagnosis not present

## 2018-12-26 DIAGNOSIS — E860 Dehydration: Secondary | ICD-10-CM | POA: Insufficient documentation

## 2018-12-26 DIAGNOSIS — M62838 Other muscle spasm: Secondary | ICD-10-CM | POA: Diagnosis not present

## 2018-12-26 LAB — COMPREHENSIVE METABOLIC PANEL
ALBUMIN: 4.1 g/dL (ref 3.5–5.0)
ALT: 30 U/L (ref 0–44)
AST: 23 U/L (ref 15–41)
Alkaline Phosphatase: 61 U/L (ref 38–126)
Anion gap: 12 (ref 5–15)
BUN: 5 mg/dL — ABNORMAL LOW (ref 6–20)
CALCIUM: 9.3 mg/dL (ref 8.9–10.3)
CO2: 19 mmol/L — ABNORMAL LOW (ref 22–32)
CREATININE: 0.77 mg/dL (ref 0.44–1.00)
Chloride: 103 mmol/L (ref 98–111)
GFR calc Af Amer: >60 mL/min (ref >60)
GFR calc non Af Amer: >60 mL/min (ref >60)
GLUCOSE: 91 mg/dL (ref 70–99)
Potassium: 3.7 mmol/L (ref 3.5–5.1)
Sodium: 134 mmol/L — ABNORMAL LOW (ref 135–145)
Total Bilirubin: 1.1 mg/dL (ref 0.3–1.2)
Total Protein: 7 g/dL (ref 6.5–8.1)

## 2018-12-26 LAB — URINALYSIS, ROUTINE W REFLEX MICROSCOPIC
Bilirubin Urine: NEGATIVE
Glucose, UA: NEGATIVE mg/dL
Hgb urine dipstick: NEGATIVE
Ketones, ur: 80 mg/dL — AB
LEUKOCYTE UA: NEGATIVE
Nitrite: NEGATIVE
Protein, ur: NEGATIVE mg/dL
Specific Gravity, Urine: 1.028 (ref 1.005–1.030)
pH: 5 (ref 5.0–8.0)

## 2018-12-26 LAB — CBC
HCT: 38.6 % (ref 36.0–46.0)
HEMOGLOBIN: 13.6 g/dL (ref 12.0–15.0)
MCH: 32.2 pg (ref 26.0–34.0)
MCHC: 35.2 g/dL (ref 30.0–36.0)
MCV: 91.3 fL (ref 80.0–100.0)
Platelets: 234 10*3/uL (ref 150–400)
RBC: 4.23 MIL/uL (ref 3.87–5.11)
RDW: 11.6 % (ref 11.5–15.5)
WBC: 9 10*3/uL (ref 4.0–10.5)
nRBC: 0 % (ref 0.0–0.2)

## 2018-12-26 MED ORDER — FAMOTIDINE 20 MG IN NS 100 ML IVPB
20.0000 mg | Freq: Once | INTRAVENOUS | Status: AC
  Administered 2018-12-26: 20 mg via INTRAVENOUS
  Filled 2018-12-26: qty 100

## 2018-12-26 MED ORDER — M.V.I. ADULT IV INJ
Freq: Once | INTRAVENOUS | Status: AC
  Administered 2018-12-26: 20:00:00 via INTRAVENOUS
  Filled 2018-12-26: qty 10

## 2018-12-26 MED ORDER — METOCLOPRAMIDE HCL 5 MG/ML IJ SOLN
10.0000 mg | Freq: Once | INTRAMUSCULAR | Status: AC
  Administered 2018-12-26: 10 mg via INTRAVENOUS
  Filled 2018-12-26: qty 2

## 2018-12-26 MED ORDER — PROMETHAZINE HCL 25 MG/ML IJ SOLN
12.5000 mg | Freq: Once | INTRAMUSCULAR | Status: AC
  Administered 2018-12-26: 12.5 mg via INTRAVENOUS
  Filled 2018-12-26: qty 1

## 2018-12-26 MED ORDER — SCOPOLAMINE 1 MG/3DAYS TD PT72
1.0000 | MEDICATED_PATCH | Freq: Once | TRANSDERMAL | Status: DC
  Administered 2018-12-26: 1.5 mg via TRANSDERMAL
  Filled 2018-12-26: qty 1

## 2018-12-26 MED ORDER — FAMOTIDINE IN NACL 20-0.9 MG/50ML-% IV SOLN
20.0000 mg | Freq: Once | INTRAVENOUS | Status: DC
  Filled 2018-12-26: qty 50

## 2018-12-26 MED ORDER — LACTATED RINGERS IV SOLN
INTRAVENOUS | Status: DC
  Administered 2018-12-26: 18:00:00 via INTRAVENOUS

## 2018-12-26 MED ORDER — LACTATED RINGERS IV BOLUS
1000.0000 mL | Freq: Once | INTRAVENOUS | Status: AC
  Administered 2018-12-26: 1000 mL via INTRAVENOUS

## 2018-12-26 NOTE — MAU Note (Signed)
Pt presents to MAU with complaints of nausea and vomiting. States she was given fluids last Thursday here and reglan. Pt had dizziness and was told to stop taking the reglan. Has tried multiple medications for the nausea with no relief.Complains of dizziness

## 2018-12-26 NOTE — MAU Provider Note (Addendum)
Patient Linda Griffith is a 33 y.o.  G3P1011 At [redacted]w[redacted]d here with complaints of vomiting every 45 minutes. She denies vaginal bleeding, vaginal discharge, or other ob-gyn complaints. She is a patient of [facility].  History     CSN: [id number]  Arrival date and time: 12/26/18 1650   First Provider Initiated Contact with Patient 12/26/18 1741      Chief Complaint  Patient presents with   Dehydration   Emesis   This is a new problem. The current episode started in the past 7 days. The problem occurs more than 10 times per day. The problem has been unchanged. The emesis has an appearance of stomach contents and bile. There has been no fever. Associated symptoms include dizziness.   Patient was seen in MAU on 3-19; she was given LR bolus, D5LR bolus,  Pepcid 20 mg x 1, Reglan 10 mg IV at that visit and felt better. She was discharged with scopalamine patch, Reglan and Zantac.   Over the past week, she has been taking Reglan, Zantac and Unasom B6. She developed blurry vision two days ago, and called her MD's office about it yesterday. Her doctor's office told her to stop taking Reglan because of her blurry vision, although the patient has read that Zantac can cause blurry vision.   The patient has MS and is worried that she is taking too much medicine and that it could be harmful for her baby. Both the patient and FOB are concerned that her medication keeps changing and that she will not get better. She and her FOB are concerned that she has 80 of ketones in her urine.   OB History    Gravida  3   Para  1   Term  1   Preterm      AB  1   Living  1     SAB  1   TAB      Ectopic      Multiple  0   Live Births  1           Past Medical History:  Diagnosis Date   Joint pain    knee, and lower leg   Migraine without aura    Multiple sclerosis (HCC) 03/2014   Muscle spasms of lower extremity    Paresthesias 02/02/2014   Sinusitis    Sprain of  thoracic region    Thoracic region    Past Surgical History:  Procedure Laterality Date   NO PAST SURGERIES      Family History  Problem Relation Age of Onset   Hypertension Sister     Social History   Tobacco Use   Smoking status: Never Smoker   Smokeless tobacco: Never Used  Substance Use Topics   Alcohol use: Not Currently    Comment: occ 3-6 drinks per week   Drug use: No    Allergies:  Allergies  Allergen Reactions   Amoxicillin Rash   Penicillins Hives and Rash    Medications Prior to Admission  Medication Sig Dispense Refill Last Dose   Prenatal Vit-Fe Fumarate-FA (PRENATAL MULTIVITAMIN) TABS tablet Take 1 tablet by mouth at bedtime.   12/26/2018 at Unknown time   ranitidine (ZANTAC) 150 MG tablet Take 1 tablet (150 mg total) by mouth 2 (two) times daily. 60 tablet 1 12/26/2018 at Unknown time   doxylamine, Sleep, (UNISOM) 25 MG tablet Take 25 mg by mouth at bedtime as needed for sleep.   12/18/2018 at Unknown  time   metoCLOPramide (REGLAN) 10 MG tablet Take 1 tablet (10 mg total) by mouth 4 (four) times daily -  before meals and at bedtime for 180 doses. 90 tablet 1    pyridOXINE (VITAMIN B-6) 50 MG tablet Take 50 mg by mouth daily.   12/18/2018 at Unknown time   scopolamine (TRANSDERM-SCOP, 1.5 MG,) 1 MG/3DAYS Place 1 patch (1.5 mg total) onto the skin every 3 (three) days. 2 patch 1     Review of Systems  Constitutional: Negative.   HENT: Negative.   Respiratory: Negative.   Gastrointestinal: Positive for vomiting.  Genitourinary: Negative.   Musculoskeletal: Negative.   Neurological: Positive for dizziness.   Physical Exam   Blood pressure 135/79, pulse 89, temperature 98.3 F (36.8 C), resp. rate 18, height 5\' 9"  (1.753 m), weight 108.9 kg, last menstrual period 10/24/2018, SpO2 97 %, unknown if currently breastfeeding.  Physical Exam  Constitutional: She is oriented to person, place, and time. She appears well-developed.  HENT:  Head:  Normocephalic.  Eyes: Pupils are equal, round, and reactive to light.  Neck: Normal range of motion.  GI: Soft.  Neurological: She is alert and oriented to person, place, and time.  Skin: Skin is warm and dry.    MAU Course  Procedures  MDM -CBC and CMP: all normal.  -UA shows 80 of ketones -IV LR started, will add pepcid and Reglan IV push.  -Patient is requesting banana bag; will order MVI bag to follow her LR.  -In to see patient at 75; patient does not report any relief from pepcid and Reglan. She and husband have many questions about her treatment here and what our plan of care is. Information given to patient on Zofran.  Patient care endorsed to on-going provider at 2019.   Assessment and Plan    Charlesetta Garibaldi Kooistra 12/26/2018, 6:10 PM   Addendum 8:28 PM -Report received and care assumed of patient. -In room to assess. -Patient and husband requesting "phenergan IV push" after reading over information provided. -Patient reports 3 episodes of vomiting since arrival.    -Patient reports eating waffles, english muffins, baked potatoes, spaghetti noodles with butter, and chicken broth, but has not been able to keep any of these medications down today. She reports drinking snapple and ice chips.  She reports taking 2 doses of phenergan 25 mg and able to tolerate without issues.  However, continues to have nausea. -Patient educated on first trimester experiences including nausea/vomiting.  Informed that nausea/vomiting would occur, but expectation is for some tolerance of small meals and that being completely N/V free may be an unreasonable expectation.  -Patient reports she has been experiencing blurry vision, but has not been to the eye doctor in a long time.  Blurry vision is only experienced "up close"  -She reports that she feels the blurry vision was due to taking medications and states Zantac listed blurry vision as a side effect.  -Educated on how generalized blurry  vision more of a concern rather than situational vision changes-recommended seeking out opthamology services.   -Will give phenergan 12.5mg  IV and reassess.  Follow Up (10:33 PM)  -Patient reports ability to tolerant ice chips and small chips of ginger ale. -Patient eating saltine crackers while provider at bedside. -Discussed need for compliance with medication regime. *Instructed to take B6 and Unisom tonight as well as Reglan. *Instructed to take Reglan as she has been taking in the past and phenergan as needed. -Discussed discontinuation of Zantac as concerned  that it is a contributor to blurry vision. -Reiterated need to seek care of opthalmologist for eye exam.  *Informed that she could use Tums as instructed on the bottle. -Discussed proper nutrition while dealing with N/V. -Encouraged to return if symptoms worsen or if new symptoms develop. -Encouraged to contact primary ob for other questions or concerns. -Informed that primary ob may modify medication regime as they deem appropriate. -Questions and concerns of patient and husband addressed. -Requests that IV remain in place so that fluids can be given at home.  Informed that this would be a home health role and would need to be deemed necessary and set up by her primary ob.  -Discharged to home in stable condition.   Cherre Robins MSN, CNM 12/27/2018 12:20 AM

## 2018-12-26 NOTE — Discharge Instructions (Signed)
Morning Sickness ° °Morning sickness is when a woman feels nauseous during pregnancy. This nauseous feeling may or may not come with vomiting. It often occurs in the morning, but it can be a problem at any time of day. Morning sickness is most common during the first trimester. In some cases, it may continue throughout pregnancy. Although morning sickness is unpleasant, it is usually harmless unless the woman develops severe and continual vomiting (hyperemesis gravidarum), a condition that requires more intense treatment. °What are the causes? °The exact cause of this condition is not known, but it seems to be related to normal hormonal changes that occur in pregnancy. °What increases the risk? °You are more likely to develop this condition if: °· You experienced nausea or vomiting before your pregnancy. °· You had morning sickness during a previous pregnancy. °· You are pregnant with more than one baby, such as twins. °What are the signs or symptoms? °Symptoms of this condition include: °· Nausea. °· Vomiting. °How is this diagnosed? °This condition is usually diagnosed based on your signs and symptoms. °How is this treated? °In many cases, treatment is not needed for this condition. Making some changes to what you eat may help to control symptoms. Your health care provider may also prescribe or recommend: °· Vitamin B6 supplements. °· Anti-nausea medicines. °· Ginger. °Follow these instructions at home: °Medicines °· Take over-the-counter and prescription medicines only as told by your health care provider. Do not use any prescription, over-the-counter, or herbal medicines for morning sickness without first talking with your health care provider. °· Taking multivitamins before getting pregnant can prevent or decrease the severity of morning sickness in most women. °Eating and drinking °· Eat a piece of dry toast or crackers before getting out of bed in the morning. °· Eat 5 or 6 small meals a day. °· Eat dry and  bland foods, such as rice or a baked potato. Foods that are high in carbohydrates are often helpful. °· Avoid greasy, fatty, and spicy foods. °· Have someone cook for you if the smell of any food causes nausea and vomiting. °· If you feel nauseous after taking prenatal vitamins, take the vitamins at night or with a snack. °· Snack on protein foods between meals if you are hungry. Nuts, yogurt, and cheese are good options. °· Drink fluids throughout the day. °· Try ginger ale made with real ginger, ginger tea made from fresh grated ginger, or ginger candies. °General instructions °· Do not use any products that contain nicotine or tobacco, such as cigarettes and e-cigarettes. If you need help quitting, ask your health care provider. °· Get an air purifier to keep the air in your house free of odors. °· Get plenty of fresh air. °· Try to avoid odors that trigger your nausea. °· Consider trying these methods to help relieve symptoms: °? Wearing an acupressure wristband. These wristbands are often worn for seasickness. °? Acupuncture. °Contact a health care provider if: °· Your home remedies are not working and you need medicine. °· You feel dizzy or light-headed. °· You are losing weight. °Get help right away if: °· You have persistent and uncontrolled nausea and vomiting. °· You faint. °· You have severe pain in your abdomen. °Summary °· Morning sickness is when a woman feels nauseous during pregnancy. This nauseous feeling may or may not come with vomiting. °· Morning sickness is most common during the first trimester. °· It often occurs in the morning, but it can be a problem at   any time of day. °· In many cases, treatment is not needed for this condition. Making some changes to what you eat may help to control symptoms. °This information is not intended to replace advice given to you by your health care provider. Make sure you discuss any questions you have with your health care provider. °Document Released:  11/08/2006 Document Revised: 10/20/2016 Document Reviewed: 10/20/2016 °Elsevier Interactive Patient Education © 2019 [employer] Inc. °Bland Diet °A bland diet consists of foods that are often soft and do not have a lot of fat, fiber, or extra seasonings. Foods without fat, fiber, or seasoning are easier for the body to digest. They are also less likely to irritate your mouth, throat, stomach, and other parts of your digestive system. A bland diet is sometimes called a BRAT diet. °What is my plan? °Your health care provider or food and nutrition specialist (dietitian) may recommend specific changes to your diet to prevent symptoms or to treat your symptoms. These changes may include: °· Eating small meals often. °· Cooking food until it is soft enough to chew easily. °· Chewing your food well. °· Drinking fluids slowly. °· Not eating foods that are very spicy, sour, or fatty. °· Not eating citrus fruits, such as oranges and grapefruit. °What do I need to know about this diet? °· Eat a variety of foods from the bland diet food list. °· Do not follow a bland diet longer than needed. °· Ask your health care provider whether you should take vitamins or supplements. °What foods can I eat? °Grains ° °Hot cereals, such as cream of wheat. Rice. Bread, crackers, or tortillas made from refined white flour. °Vegetables °Canned or cooked vegetables. Mashed or boiled potatoes. °Fruits ° °Bananas. Applesauce. Other types of cooked or canned fruit with the skin and seeds removed, such as canned peaches or pears. °Meats and other proteins ° °Scrambled eggs. Creamy peanut butter or other nut butters. Lean, well-cooked meats, such as chicken or fish. Tofu. Soups or broths. °Dairy °Low-fat dairy products, such as milk, cottage cheese, or yogurt. °Beverages ° °Water. Herbal tea. Apple juice. °Fats and oils °Mild salad dressings. [place] or olive oil. °Sweets and desserts °Pudding. Custard. Fruit gelatin. Ice cream. °The items listed above  may not be a complete list of recommended foods and beverages. Contact a dietitian for more options. °What foods are not recommended? °Grains °Whole grain breads and cereals. °Vegetables °Raw vegetables. °Fruits °Raw fruits, especially citrus, berries, or dried fruits. °Dairy °Whole fat dairy foods. °Beverages °Caffeinated drinks. Alcohol. °Seasonings and condiments °Strongly flavored seasonings or condiments. Hot sauce. Salsa. °Other foods °Spicy foods. Fried foods. Sour foods, such as pickled or fermented foods. Foods with high sugar content. Foods high in fiber. °The items listed above may not be a complete list of foods and beverages to avoid. Contact a dietitian for more information. °Summary °· A bland diet consists of foods that are often soft and do not have a lot of fat, fiber, or extra seasonings. °· Foods without fat, fiber, or seasoning are easier for the body to digest. °· Check with your health care provider to see how long you should follow this diet plan. It is not meant to be followed for long periods. °This information is not intended to replace advice given to you by your health care provider. Make sure you discuss any questions you have with your health care provider. °Document Released: 01/09/2016 Document Revised: 10/16/2017 Document Reviewed: 10/16/2017 °Elsevier Interactive Patient Education © 2019 [employer]   [employer]. ° °

## 2019-03-26 ENCOUNTER — Other Ambulatory Visit: Payer: Self-pay

## 2019-03-26 ENCOUNTER — Inpatient Hospital Stay (HOSPITAL_COMMUNITY)
Admission: AD | Admit: 2019-03-26 | Discharge: 2019-03-26 | Disposition: A | Discharge status: Home / Self Care | Payer: BC Managed Care – PPO | Attending: Obstetrics & Gynecology | Admitting: Obstetrics & Gynecology

## 2019-03-26 DIAGNOSIS — G35 Multiple sclerosis: Secondary | ICD-10-CM | POA: Diagnosis not present

## 2019-03-26 DIAGNOSIS — O26892 Other specified pregnancy related conditions, second trimester: Secondary | ICD-10-CM | POA: Diagnosis not present

## 2019-03-26 DIAGNOSIS — R1013 Epigastric pain: Secondary | ICD-10-CM | POA: Diagnosis not present

## 2019-03-26 DIAGNOSIS — G35D Multiple sclerosis, unspecified: Secondary | ICD-10-CM

## 2019-03-26 DIAGNOSIS — Z880 Allergy status to penicillin: Secondary | ICD-10-CM | POA: Diagnosis not present

## 2019-03-26 DIAGNOSIS — Z3A21 21 weeks gestation of pregnancy: Secondary | ICD-10-CM | POA: Diagnosis not present

## 2019-03-26 DIAGNOSIS — Z3492 Encounter for supervision of normal pregnancy, unspecified, second trimester: Secondary | ICD-10-CM

## 2019-03-26 DIAGNOSIS — O26899 Other specified pregnancy related conditions, unspecified trimester: Secondary | ICD-10-CM

## 2019-03-26 LAB — COMPREHENSIVE METABOLIC PANEL
ALT: 25 U/L (ref 0–44)
AST: 27 U/L (ref 15–41)
Albumin: 3.4 g/dL — ABNORMAL LOW (ref 3.5–5.0)
Alkaline Phosphatase: 57 U/L (ref 38–126)
Anion gap: 11 (ref 5–15)
BUN: 6 mg/dL (ref 6–20)
CO2: 22 mmol/L (ref 22–32)
Calcium: 9 mg/dL (ref 8.9–10.3)
Chloride: 102 mmol/L (ref 98–111)
Creatinine, Ser: 0.66 mg/dL (ref 0.44–1.00)
GFR calc Af Amer: >60 mL/min (ref >60)
GFR calc non Af Amer: >60 mL/min (ref >60)
Glucose, Bld: 84 mg/dL (ref 70–99)
Potassium: 4.4 mmol/L (ref 3.5–5.1)
Sodium: 135 mmol/L (ref 135–145)
Total Bilirubin: 1.2 mg/dL (ref 0.3–1.2)
Total Protein: 6.6 g/dL (ref 6.5–8.1)

## 2019-03-26 LAB — CBC WITH DIFFERENTIAL/PLATELET
Abs Immature Granulocytes: 0.15 10*3/uL — ABNORMAL HIGH (ref 0.00–0.07)
Basophils Absolute: 0 10*3/uL (ref 0.0–0.1)
Basophils Relative: 0 %
Eosinophils Absolute: 0 10*3/uL (ref 0.0–0.5)
Eosinophils Relative: 0 %
HCT: 34.5 % — ABNORMAL LOW (ref 36.0–46.0)
Hemoglobin: 11.7 g/dL — ABNORMAL LOW (ref 12.0–15.0)
Immature Granulocytes: 1 %
Lymphocytes Relative: 8 %
Lymphs Abs: 1 10*3/uL (ref 0.7–4.0)
MCH: 32.9 pg (ref 26.0–34.0)
MCHC: 33.9 g/dL (ref 30.0–36.0)
MCV: 96.9 fL (ref 80.0–100.0)
Monocytes Absolute: 0.5 10*3/uL (ref 0.1–1.0)
Monocytes Relative: 4 %
Neutro Abs: 10.9 10*3/uL — ABNORMAL HIGH (ref 1.7–7.7)
Neutrophils Relative %: 87 %
Platelets: 190 10*3/uL (ref 150–400)
RBC: 3.56 MIL/uL — ABNORMAL LOW (ref 3.87–5.11)
RDW: 12.9 % (ref 11.5–15.5)
WBC: 12.6 10*3/uL — ABNORMAL HIGH (ref 4.0–10.5)
nRBC: 0 % (ref 0.0–0.2)

## 2019-03-26 LAB — URINALYSIS, ROUTINE W REFLEX MICROSCOPIC
Bilirubin Urine: NEGATIVE
Glucose, UA: NEGATIVE mg/dL
Hgb urine dipstick: NEGATIVE
Ketones, ur: 15 mg/dL — AB
Nitrite: NEGATIVE
Protein, ur: 30 mg/dL — AB
Specific Gravity, Urine: >1.03 — ABNORMAL HIGH (ref 1.005–1.030)
pH: 5.5 (ref 5.0–8.0)

## 2019-03-26 LAB — URINALYSIS, MICROSCOPIC (REFLEX): RBC / HPF: NONE SEEN RBC/hpf (ref 0–5)

## 2019-03-26 LAB — AMYLASE: Amylase: 117 U/L — ABNORMAL HIGH (ref 28–100)

## 2019-03-26 LAB — LIPASE, BLOOD: Lipase: 30 U/L (ref 11–51)

## 2019-03-26 NOTE — MAU Provider Note (Signed)
History     CSN: [id number]  Arrival date and time: 03/26/19 1245   First Provider Initiated Contact with Patient 03/26/19 1327     Chief Complaint  Patient presents with  . Abdominal Pain   HPI Linda Griffith is a 33 y.o. G3P1011 at [redacted]w[redacted]d who presents to MAU with chief complaint of epigastric pain.  This is a new problem, onset today around 1215. Patient states she sat on the commode to void, experienced an episode of sharp 10/10 pain which caused her to scream in pain, then vomitted 2-3 times. When describing her pain she points to the epigastric area of her upper abdomen but also intermittently gestures to her substernal chest. She denies abdominal pain and chest pain of any kind upon arrival in MAU.   Palpitations Patient reports heart palpitations, new onset three days ago on 03/23/19. She states she felt palpitations all day. Then on 03/24/19 she experienced the same palpitations but for only a portion of the day. Today she denies palpitations. She denies history of cardiac issues, chest pain, weakness, SOB and syncope.  Subscapular pain Patient reports recurrent subscapular back pain, coinciding with her heart palpitations. She is unable to provide a pain rating. She denies subscapular pain upon arrival in MAU.  Patient denies vaginal bleeding, abdominal tenderness, dysuria, leaking of fluid, fever, falls, or recent illness.  She denies concerns related to voiding or stooling. She endorses normal diet and most recently had a bowl of cereal with milk for breakfast.  Patient declines all offers of medication and or comfort-based interventions throughout her period of evaluation in MAU. She did not take medication or attempt other interventions for any of her complaints prior to her arrival in MAU.  Patient's pregnancy is complicated by multiple sclerosis, diagnosed in 2015. Not currently on medication per pt.  OB History    Gravida  3   Para  1   Term  1   Preterm       AB  1   Living  1     SAB  1   TAB      Ectopic      Multiple  0   Live Births  1           Past Medical History:  Diagnosis Date  . Joint pain    knee, and lower leg  . Migraine without aura   . Multiple sclerosis ([facility]) 03/2014  . Muscle spasms of lower extremity   . Paresthesias 02/02/2014  . Sinusitis   . Sprain of thoracic region    Thoracic region    Past Surgical History:  Procedure Laterality Date  . NO PAST SURGERIES      Family History  Problem Relation Age of Onset  . Hypertension Sister     Social History   Tobacco Use  . Smoking status: Never Smoker  . Smokeless tobacco: Never Used  Substance Use Topics  . Alcohol use: Not Currently    Comment: occ 3-6 drinks per week  . Drug use: No    Allergies:  Allergies  Allergen Reactions  . Amoxicillin Rash  . Penicillins Hives and Rash    Medications Prior to Admission  Medication Sig Dispense Refill Last Dose  . Prenatal Vit-Fe Fumarate-FA (PRENATAL MULTIVITAMIN) TABS tablet Take 1 tablet by mouth at bedtime.   03/25/2019 at Unknown time  . promethazine (PHENERGAN) 25 MG tablet Take 25 mg by mouth every 6 (six) hours as needed for nausea or vomiting.  03/26/2019 at Unknown time  . doxylamine, Sleep, (UNISOM) 25 MG tablet Take 25 mg by mouth at bedtime as needed for sleep.     . metoCLOPramide (REGLAN) 10 MG tablet Take 1 tablet (10 mg total) by mouth 4 (four) times daily -  before meals and at bedtime for 180 doses. 90 tablet 1   . pyridOXINE (VITAMIN B-6) 50 MG tablet Take 50 mg by mouth daily.     Marland Kitchen scopolamine (TRANSDERM-SCOP, 1.5 MG,) 1 MG/3DAYS Place 1 patch (1.5 mg total) onto the skin every 3 (three) days. 2 patch 1     Review of Systems  Constitutional: Negative for chills, fatigue and fever.  Cardiovascular: Positive for palpitations.  Gastrointestinal: Positive for abdominal pain, nausea and vomiting.  Genitourinary: Negative for difficulty urinating, dysuria, flank pain, vaginal  bleeding, vaginal discharge and vaginal pain.  Musculoskeletal: Positive for back pain.  Neurological: Negative for dizziness, syncope, weakness and headaches.  All other systems reviewed and are negative.  Physical Exam   Blood pressure 134/71, pulse (!) 104, temperature 98.2 F (36.8 C), temperature source Oral, resp. rate 18, height 5' 9.5" (1.765 m), weight 116.1 kg, last menstrual period 10/24/2018, SpO2 98 %, unknown if currently breastfeeding.  Physical Exam  Nursing note and vitals reviewed. Constitutional: She is oriented to person, place, and time. She appears well-developed and well-nourished.  Cardiovascular: Normal rate.  Respiratory: Effort normal and breath sounds normal. No respiratory distress.  GI: Soft. She exhibits no distension. There is no abdominal tenderness. There is no rebound, no guarding and no CVA tenderness.  Musculoskeletal: Normal range of motion.  Neurological: She is alert and oriented to person, place, and time.  Skin: Skin is warm and dry.  Psychiatric: She has a normal mood and affect. Her behavior is normal. Judgment and thought content normal.   MAU Course/MDM  Procedures  --HPI, PE, and lab results reviewed with Dr. Alysia Penna, Faculty Practice Attending --Patient asymptomatic throughout evaluation in MAU --Normal EKG, read by Dr. Antoine Poche, Cardiology --+ Leuks on UA, presence of epithelial cells suggests possibility of contaminated catch. Pertinent negatives include dysuria, hematuria, flank pain, hx UTIs or kidney stones. Urine culture in work. --Patient emphatically denies suggestion of chest pain secondary to GERD. Reports to CNM that she had issues with reflux early in current pregnancy but states those symptoms were "completely different" from any of her current symptoms.   Patient Vitals for the past 24 hrs:  BP Temp Temp src Pulse Resp SpO2 Height Weight  03/26/19 1530 -- -- -- -- -- 96 % -- --  03/26/19 1529 139/79 98.1 F (36.7 C) Oral 96  16 -- -- --  03/26/19 1515 -- -- -- -- -- 97 % -- --  03/26/19 1510 -- -- -- -- -- 97 % -- --  03/26/19 1505 -- -- -- -- -- 97 % -- --  03/26/19 1500 -- -- -- -- -- 98 % -- --  03/26/19 1455 -- -- -- -- -- 98 % -- --  03/26/19 1326 134/71 98.2 F (36.8 C) Oral (!) 104 18 98 % -- --  03/26/19 1258 140/82 97.7 F (36.5 C) -- (!) 101 18 -- 5' 9.5" (1.765 m) 116.1 kg   Results for orders placed or performed during the hospital encounter of 03/26/19 (from the past 24 hour(s))  Urinalysis, Routine w reflex microscopic     Status: Abnormal   Collection Time: 03/26/19  1:30 PM  Result Value Ref Range   Color, Urine YELLOW  YELLOW   APPearance HAZY (A) CLEAR   Specific Gravity, Urine >1.030 (H) 1.005 - 1.030   pH 5.5 5.0 - 8.0   Glucose, UA NEGATIVE NEGATIVE mg/dL   Hgb urine dipstick NEGATIVE NEGATIVE   Bilirubin Urine NEGATIVE NEGATIVE   Ketones, ur 15 (A) NEGATIVE mg/dL   Protein, ur 30 (A) NEGATIVE mg/dL   Nitrite NEGATIVE NEGATIVE   Leukocytes,Ua TRACE (A) NEGATIVE  Urinalysis, Microscopic (reflex)     Status: Abnormal   Collection Time: 03/26/19  1:30 PM  Result Value Ref Range   RBC / HPF NONE SEEN 0 - 5 RBC/hpf   WBC, UA 0-5 0 - 5 WBC/hpf   Bacteria, UA MANY (A) NONE SEEN   Squamous Epithelial / LPF 11-20 0 - 5   Crystals CA OXALATE CRYSTALS (A) NEGATIVE  CBC with Differential/Platelet     Status: Abnormal   Collection Time: 03/26/19  2:02 PM  Result Value Ref Range   WBC 12.6 (H) 4.0 - 10.5 K/uL   RBC 3.56 (L) 3.87 - 5.11 MIL/uL   Hemoglobin 11.7 (L) 12.0 - 15.0 g/dL   HCT 16.1 (L) 09.6 - 04.5 %   MCV 96.9 80.0 - 100.0 fL   MCH 32.9 26.0 - 34.0 pg   MCHC 33.9 30.0 - 36.0 g/dL   RDW 40.9 81.1 - 91.4 %   Platelets 190 150 - 400 K/uL   nRBC 0.0 0.0 - 0.2 %   Neutrophils Relative % 87 %   Neutro Abs 10.9 (H) 1.7 - 7.7 K/uL   Lymphocytes Relative 8 %   Lymphs Abs 1.0 0.7 - 4.0 K/uL   Monocytes Relative 4 %   Monocytes Absolute 0.5 0.1 - 1.0 K/uL   Eosinophils Relative 0  %   Eosinophils Absolute 0.0 0.0 - 0.5 K/uL   Basophils Relative 0 %   Basophils Absolute 0.0 0.0 - 0.1 K/uL   Immature Granulocytes 1 %   Abs Immature Granulocytes 0.15 (H) 0.00 - 0.07 K/uL  Comprehensive metabolic panel     Status: Abnormal   Collection Time: 03/26/19  2:02 PM  Result Value Ref Range   Sodium 135 135 - 145 mmol/L   Potassium 4.4 3.5 - 5.1 mmol/L   Chloride 102 98 - 111 mmol/L   CO2 22 22 - 32 mmol/L   Glucose, Bld 84 70 - 99 mg/dL   BUN 6 6 - 20 mg/dL   Creatinine, Ser 7.82 0.44 - 1.00 mg/dL   Calcium 9.0 8.9 - 95.6 mg/dL   Total Protein 6.6 6.5 - 8.1 g/dL   Albumin 3.4 (L) 3.5 - 5.0 g/dL   AST 27 15 - 41 U/L   ALT 25 0 - 44 U/L   Alkaline Phosphatase 57 38 - 126 U/L   Total Bilirubin 1.2 0.3 - 1.2 mg/dL   GFR calc non Af Amer >60 >60 mL/min   GFR calc Af Amer >60 >60 mL/min   Anion gap 11 5 - 15  Amylase     Status: Abnormal   Collection Time: 03/26/19  2:02 PM  Result Value Ref Range   Amylase 117 (H) 28 - 100 U/L  Lipase, blood     Status: None   Collection Time: 03/26/19  2:02 PM  Result Value Ref Range   Lipase 30 11 - 51 U/L   Assessment and Plan  --33 y.o. G3P1011 at [redacted]w[redacted]d  --FHT 150 by Doppler --Epigastric pain in pregnancy --Asymptomatic throughout evaluation in MAU --S/p normal EKG --Urine culture  in work --Per Dr. Alysia Penna, patient stable for discharge, return to MAU for increasing acuity of pregnancy-related complications  F/U: Patient's next appointment in  Clinic is Monday 03/30/19  Calvert Cantor, CNM 03/26/2019, 4:13 PM

## 2019-03-26 NOTE — Discharge Instructions (Signed)
Abdominal Pain During Pregnancy ° °Abdominal pain is common during pregnancy, and has many possible causes. Some causes are more serious than others, and sometimes the cause is not known. Abdominal pain can be a sign that labor is starting. It can also be caused by normal growth and stretching of muscles and ligaments during pregnancy. Always tell your health care provider if you have any abdominal pain. °Follow these instructions at home: °· Do not have sex or put anything in your vagina until your pain goes away completely. °· Get plenty of rest until your pain improves. °· Drink enough fluid to keep your urine pale yellow. °· Take over-the-counter and prescription medicines only as told by your health care provider. °· Keep all follow-up visits as told by your health care provider. This is important. °Contact a health care provider if: °· Your pain continues or gets worse after resting. °· You have lower abdominal pain that: °? Comes and goes at regular intervals. °? Spreads to your back. °? Is similar to menstrual cramps. °· You have pain or burning when you urinate. °Get help right away if: °· You have a fever or chills. °· You have vaginal bleeding. °· You are leaking fluid from your vagina. °· You are passing tissue from your vagina. °· You have vomiting or diarrhea that lasts for more than 24 hours. °· Your baby is moving less than usual. °· You feel very weak or faint. °· You have shortness of breath. °· You develop severe pain in your upper abdomen. °Summary °· Abdominal pain is common during pregnancy, and has many possible causes. °· If you experience abdominal pain during pregnancy, tell your health care provider right away. °· Follow your health care provider's home care instructions and keep all follow-up visits as directed. °This information is not intended to replace advice given to you by your health care provider. Make sure you discuss any questions you have with your health care  provider. °Document Released: 09/17/2005 Document Revised: 12/20/2016 Document Reviewed: 12/20/2016 °Elsevier Interactive Patient Education © 2019 [employer] ° °

## 2019-03-26 NOTE — MAU Note (Signed)
Pt reports having epigastric pain about 1-2 hours ago. Has had periods of heart palpations off and on throughout the pregnancy but none today.

## 2019-03-27 LAB — CULTURE, OB URINE: Culture: NO GROWTH

## 2019-07-01 LAB — OB RESULTS CONSOLE GBS: GBS: NEGATIVE

## 2019-07-21 ENCOUNTER — Telehealth (HOSPITAL_COMMUNITY): Payer: Self-pay | Admitting: *Deleted

## 2019-07-21 ENCOUNTER — Other Ambulatory Visit (HOSPITAL_COMMUNITY)
Admission: RE | Admit: 2019-07-21 | Discharge: 2019-07-21 | Disposition: A | Discharge status: Home / Self Care | Payer: BC Managed Care – PPO | Source: Ambulatory Visit | Attending: Obstetrics & Gynecology | Admitting: Obstetrics & Gynecology

## 2019-07-21 ENCOUNTER — Other Ambulatory Visit: Payer: Self-pay

## 2019-07-21 ENCOUNTER — Encounter (HOSPITAL_COMMUNITY): Payer: Self-pay | Admitting: *Deleted

## 2019-07-21 DIAGNOSIS — Z20828 Contact with and (suspected) exposure to other viral communicable diseases: Secondary | ICD-10-CM | POA: Diagnosis present

## 2019-07-21 LAB — SARS CORONAVIRUS 2 BY RT PCR (HOSPITAL ORDER, PERFORMED IN ~~LOC~~ HOSPITAL LAB): SARS Coronavirus 2: NEGATIVE

## 2019-07-21 NOTE — MAU Note (Signed)
Asymptomatic, swab collected.

## 2019-07-21 NOTE — Telephone Encounter (Signed)
Preadmission screen ° °

## 2019-07-23 ENCOUNTER — Inpatient Hospital Stay (HOSPITAL_COMMUNITY)
Admission: RE | Admit: 2019-07-23 | Discharge: 2019-07-24 | DRG: 807 | Disposition: A | Discharge status: Home / Self Care | Payer: BC Managed Care – PPO | Attending: Obstetrics and Gynecology | Admitting: Obstetrics and Gynecology

## 2019-07-23 ENCOUNTER — Encounter (HOSPITAL_COMMUNITY): Payer: Self-pay

## 2019-07-23 ENCOUNTER — Inpatient Hospital Stay (HOSPITAL_COMMUNITY): Payer: BC Managed Care – PPO | Admitting: Anesthesiology

## 2019-07-23 ENCOUNTER — Inpatient Hospital Stay (HOSPITAL_COMMUNITY): Payer: BC Managed Care – PPO

## 2019-07-23 DIAGNOSIS — O99354 Diseases of the nervous system complicating childbirth: Secondary | ICD-10-CM | POA: Diagnosis present

## 2019-07-23 DIAGNOSIS — O26893 Other specified pregnancy related conditions, third trimester: Secondary | ICD-10-CM | POA: Diagnosis present

## 2019-07-23 DIAGNOSIS — Z3A39 39 weeks gestation of pregnancy: Secondary | ICD-10-CM

## 2019-07-23 DIAGNOSIS — G35 Multiple sclerosis: Secondary | ICD-10-CM | POA: Diagnosis present

## 2019-07-23 DIAGNOSIS — Z3490 Encounter for supervision of normal pregnancy, unspecified, unspecified trimester: Secondary | ICD-10-CM | POA: Diagnosis present

## 2019-07-23 LAB — CBC
HCT: 36 % (ref 36.0–46.0)
Hemoglobin: 12.3 g/dL (ref 12.0–15.0)
MCH: 33.4 pg (ref 26.0–34.0)
MCHC: 34.2 g/dL (ref 30.0–36.0)
MCV: 97.8 fL (ref 80.0–100.0)
Platelets: 177 10*3/uL (ref 150–400)
RBC: 3.68 MIL/uL — ABNORMAL LOW (ref 3.87–5.11)
RDW: 13.5 % (ref 11.5–15.5)
WBC: 10.5 10*3/uL (ref 4.0–10.5)
nRBC: 0 % (ref 0.0–0.2)

## 2019-07-23 LAB — TYPE AND SCREEN
ABO/RH(D): A POS
Antibody Screen: NEGATIVE

## 2019-07-23 LAB — RPR: RPR Ser Ql: NONREACTIVE

## 2019-07-23 LAB — ABO/RH: ABO/RH(D): A POS

## 2019-07-23 MED ORDER — ACETAMINOPHEN 325 MG PO TABS
650.0000 mg | ORAL_TABLET | ORAL | Status: DC | PRN
  Filled 2019-07-23: qty 2

## 2019-07-23 MED ORDER — SIMETHICONE 80 MG PO CHEW: 80.0000 mg | CHEWABLE_TABLET | ORAL | Status: DC | PRN

## 2019-07-23 MED ORDER — LACTATED RINGERS IV SOLN: 500.0000 mL | Freq: Once | INTRAVENOUS | Status: DC

## 2019-07-23 MED ORDER — LIDOCAINE HCL (PF) 1 % IJ SOLN
INTRAMUSCULAR | Status: DC | PRN
  Administered 2019-07-23 (×2): 4 mL via EPIDURAL

## 2019-07-23 MED ORDER — MISOPROSTOL 25 MCG QUARTER TABLET
25.0000 ug | ORAL_TABLET | ORAL | Status: DC | PRN
  Administered 2019-07-23: 25 ug via VAGINAL
  Filled 2019-07-23: qty 1

## 2019-07-23 MED ORDER — MEDROXYPROGESTERONE ACETATE 150 MG/ML IM SUSP: 150.0000 mg | INTRAMUSCULAR | Status: DC | PRN

## 2019-07-23 MED ORDER — BENZOCAINE-MENTHOL 20-0.5 % EX AERO
1.0000 "application " | INHALATION_SPRAY | CUTANEOUS | Status: DC | PRN
  Administered 2019-07-23 – 2019-07-24 (×2): 1 via TOPICAL
  Filled 2019-07-23 (×2): qty 56

## 2019-07-23 MED ORDER — PRENATAL MULTIVITAMIN CH
1.0000 | ORAL_TABLET | Freq: Every day | ORAL | Status: DC
  Administered 2019-07-24: 1 via ORAL
  Filled 2019-07-23: qty 1

## 2019-07-23 MED ORDER — SODIUM CHLORIDE (PF) 0.9 % IJ SOLN
INTRAMUSCULAR | Status: DC | PRN
  Administered 2019-07-23: 12 mL/h via EPIDURAL

## 2019-07-23 MED ORDER — DIBUCAINE (PERIANAL) 1 % EX OINT: 1.0000 "application " | TOPICAL_OINTMENT | CUTANEOUS | Status: DC | PRN

## 2019-07-23 MED ORDER — OXYCODONE-ACETAMINOPHEN 5-325 MG PO TABS: 1.0000 | ORAL_TABLET | ORAL | Status: DC | PRN

## 2019-07-23 MED ORDER — LACTATED RINGERS IV SOLN
INTRAVENOUS | Status: DC
  Administered 2019-07-23 (×3): via INTRAVENOUS

## 2019-07-23 MED ORDER — DIPHENHYDRAMINE HCL 25 MG PO CAPS: 25.0000 mg | ORAL_CAPSULE | Freq: Four times a day (QID) | ORAL | Status: DC | PRN

## 2019-07-23 MED ORDER — EPHEDRINE 5 MG/ML INJ: 10.0000 mg | INTRAVENOUS | Status: DC | PRN

## 2019-07-23 MED ORDER — TETANUS-DIPHTH-ACELL PERTUSSIS 5-2.5-18.5 LF-MCG/0.5 IM SUSP: 0.5000 mL | Freq: Once | INTRAMUSCULAR | Status: DC

## 2019-07-23 MED ORDER — OXYCODONE-ACETAMINOPHEN 5-325 MG PO TABS: 2.0000 | ORAL_TABLET | ORAL | Status: DC | PRN

## 2019-07-23 MED ORDER — TERBUTALINE SULFATE 1 MG/ML IJ SOLN: 0.2500 mg | Freq: Once | INTRAMUSCULAR | Status: DC | PRN

## 2019-07-23 MED ORDER — ACETAMINOPHEN 325 MG PO TABS: 650.0000 mg | ORAL_TABLET | ORAL | Status: DC | PRN

## 2019-07-23 MED ORDER — FENTANYL-BUPIVACAINE-NACL 0.5-0.125-0.9 MG/250ML-% EP SOLN
12.0000 mL/h | EPIDURAL | Status: DC | PRN
  Filled 2019-07-23: qty 250

## 2019-07-23 MED ORDER — LACTATED RINGERS IV SOLN: 500.0000 mL | INTRAVENOUS | Status: DC | PRN

## 2019-07-23 MED ORDER — PHENYLEPHRINE 40 MCG/ML (10ML) SYRINGE FOR IV PUSH (FOR BLOOD PRESSURE SUPPORT): 80.0000 ug | PREFILLED_SYRINGE | INTRAVENOUS | Status: DC | PRN

## 2019-07-23 MED ORDER — ZOLPIDEM TARTRATE 5 MG PO TABS: 5.0000 mg | ORAL_TABLET | Freq: Every evening | ORAL | Status: DC | PRN

## 2019-07-23 MED ORDER — BUTORPHANOL TARTRATE 1 MG/ML IJ SOLN: 1.0000 mg | INTRAMUSCULAR | Status: DC | PRN

## 2019-07-23 MED ORDER — OXYTOCIN BOLUS FROM INFUSION
500.0000 mL | Freq: Once | INTRAVENOUS | Status: AC
  Administered 2019-07-23: 500 mL via INTRAVENOUS

## 2019-07-23 MED ORDER — ONDANSETRON HCL 4 MG PO TABS: 4.0000 mg | ORAL_TABLET | ORAL | Status: DC | PRN

## 2019-07-23 MED ORDER — MEASLES, MUMPS & RUBELLA VAC IJ SOLR: 0.5000 mL | Freq: Once | INTRAMUSCULAR | Status: DC

## 2019-07-23 MED ORDER — DIPHENHYDRAMINE HCL 50 MG/ML IJ SOLN: 12.5000 mg | INTRAMUSCULAR | Status: DC | PRN

## 2019-07-23 MED ORDER — SOD CITRATE-CITRIC ACID 500-334 MG/5ML PO SOLN: 30.0000 mL | ORAL | Status: DC | PRN

## 2019-07-23 MED ORDER — OXYTOCIN 40 UNITS IN NORMAL SALINE INFUSION - SIMPLE MED
1.0000 m[IU]/min | INTRAVENOUS | Status: DC
  Administered 2019-07-23: 1 m[IU]/min via INTRAVENOUS

## 2019-07-23 MED ORDER — ONDANSETRON HCL 4 MG/2ML IJ SOLN: 4.0000 mg | Freq: Four times a day (QID) | INTRAMUSCULAR | Status: DC | PRN

## 2019-07-23 MED ORDER — ONDANSETRON HCL 4 MG/2ML IJ SOLN: 4.0000 mg | INTRAMUSCULAR | Status: DC | PRN

## 2019-07-23 MED ORDER — IBUPROFEN 600 MG PO TABS
600.0000 mg | ORAL_TABLET | Freq: Four times a day (QID) | ORAL | Status: DC
  Administered 2019-07-23 – 2019-07-24 (×4): 600 mg via ORAL
  Filled 2019-07-23 (×4): qty 1

## 2019-07-23 MED ORDER — OXYTOCIN 40 UNITS IN NORMAL SALINE INFUSION - SIMPLE MED: 1.0000 m[IU]/min | INTRAVENOUS | Status: DC

## 2019-07-23 MED ORDER — LIDOCAINE HCL (PF) 1 % IJ SOLN: 30.0000 mL | INTRAMUSCULAR | Status: DC | PRN

## 2019-07-23 MED ORDER — SENNOSIDES-DOCUSATE SODIUM 8.6-50 MG PO TABS
2.0000 | ORAL_TABLET | ORAL | Status: DC
  Filled 2019-07-23: qty 2

## 2019-07-23 MED ORDER — WITCH HAZEL-GLYCERIN EX PADS: 1.0000 "application " | MEDICATED_PAD | CUTANEOUS | Status: DC | PRN

## 2019-07-23 MED ORDER — OXYTOCIN 40 UNITS IN NORMAL SALINE INFUSION - SIMPLE MED
2.5000 [IU]/h | INTRAVENOUS | Status: DC
  Filled 2019-07-23: qty 1000

## 2019-07-23 MED ORDER — COCONUT OIL OIL: 1.0000 "application " | TOPICAL_OIL | Status: DC | PRN

## 2019-07-23 NOTE — H&P (Signed)
Linda Griffith is a 33 y.o. female presenting for due to term and desires IOL.  Prgnancy uncomplicated.  GBS -. OB History    Gravida  3   Para  1   Term  1   Preterm      AB  1   Living  1     SAB  1   TAB      Ectopic      Multiple  0   Live Births  1          Past Medical History:  Diagnosis Date   Joint pain    knee, and lower leg   Migraine without aura    Multiple sclerosis ([place]) 03/2014   Muscle spasms of lower extremity    Paresthesias 02/02/2014   Sinusitis    Sprain of thoracic region    Thoracic region   Past Surgical History:  Procedure Laterality Date   NO PAST SURGERIES     Family History: family history includes Colon cancer in her mother; Hypertension in her brother. Social History:  reports that she has never smoked. She has never used smokeless tobacco. She reports previous alcohol use. She reports that she does not use drugs.     Maternal Diabetes: No Genetic Screening: Normal Maternal Ultrasounds/Referrals: Normal Fetal Ultrasounds or other Referrals:  None Maternal Substance Abuse:  No Significant Maternal Medications:  None Significant Maternal Lab Results:  Group B Strep negative Other Comments:  None  ROS History Dilation: (P) 3.5 Effacement (%): (P) 90 Station: (P) -2 Exam by:: (P) Ilyse Tremain Blood pressure 122/71, pulse 82, temperature 98.2 F (36.8 C), temperature source Oral, resp. rate 17, height 5\' 10"  (1.778 m), weight 119 kg, last menstrual period 10/24/2018, SpO2 98 %, unknown if currently breastfeeding. Exam Physical Exam  Prenatal labs: ABO, Rh: --/--/A POS, A POS Performed at [facility], [place] [address] [place], [place] [address]  [phone]) Antibody: NEG (10/22 0022) Rubella: Immune (03/01 0000) RPR: Nonreactive (03/01 0000)  HBsAg: Negative (03/01 0000)  HIV: Non-reactive (03/01 0000)  GBS: Negative/-- (09/30 0000)   Assessment/Plan: IUP at term S/P cytotec AROM and pit Anticipate  SVD   Luz Lex 07/23/2019, 9:00 AM

## 2019-07-23 NOTE — Anesthesia Postprocedure Evaluation (Signed)
Anesthesia Post Note  Patient: Britta Mccreedy  Procedure(s) Performed: AN AD [place]     Patient location during evaluation: Mother Baby Anesthesia Type: Epidural Level of consciousness: awake and alert and oriented Pain management: satisfactory to patient Vital Signs Assessment: post-procedure vital signs reviewed and stable Respiratory status: respiratory function stable Cardiovascular status: stable Postop Assessment: no headache, no backache, epidural receding, patient able to bend at knees, no signs of nausea or vomiting and adequate PO intake Anesthetic complications: no    Last Vitals:  Vitals:   07/23/19 1430 07/23/19 1600  BP: 129/83 134/79  Pulse: 87 92  Resp: 18 18  Temp: 37.2 C 36.9 C  SpO2: 98%     Last Pain:  Vitals:   07/23/19 1600  TempSrc: Axillary  PainSc: 0-No pain   Pain Goal:                   Cayci Mcnabb

## 2019-07-23 NOTE — Lactation Note (Signed)
This note was copied from a baby's chart. Lactation Consultation Note  Patient Name: Linda Griffith [id number] Date: 07/23/2019 Reason for consult: Initial assessment;Term  8 hours old FT female who is being exclusively BF by his mother, she's a P2 and experienced BF. She BF her first child for 12 months without any BF difficulties and she's familiar with hand expression; mom told LC she's been able to see some colostrum already. She has a [employer] DEBP at home.  Offered assistance with latch but mom politely declined stating that baby just fed. Asked mom to call for assistance when needed. Per mom feedings at the breast are comfortable and she can hear baby swallowing when at the breast; praised her for her efforts. Reviewed normal newborn behavior, cluster feeding, feeding cues, lactogenesis II and size of baby's stomach.  Feeding plan:  1. Encouraged mom to feed baby STS 8-12 times/24 hours or sooner if feeding cues are present 2. Hand expression and spoon feeding were also encouraged  BF brochure, BF resources and feeding diary were reviewed. Dad present and supportive, he asked LC some questions and they're all answered to his satisfaction. Parents reported all questions and concerns were answered, they're both aware of LC OP services and will call PRN.   Maternal Data Formula Feeding for Exclusion: No Has patient been taught Hand Expression?: Yes Does the patient have breastfeeding experience prior to this delivery?: Yes  Feeding Feeding Type: Breast Fed  LATCH Score Latch: Grasps breast easily, tongue down, lips flanged, rhythmical sucking.  Audible Swallowing: A few with stimulation  Type of Nipple: Everted at rest and after stimulation  Comfort (Breast/Nipple): Soft / non-tender  Hold (Positioning): Assistance needed to correctly position infant at breast and maintain latch.  LATCH Score: 8  Interventions Interventions: Breast feeding basics reviewed  Lactation Tools  Discussed/Used WIC Program: No   Consult Status Consult Status: PRN Date: 07/24/19 Follow-up type: In-patient    Kiyan Burmester Venetia Constable 07/23/2019, 8:42 PM

## 2019-07-23 NOTE — Anesthesia Preprocedure Evaluation (Signed)
Anesthesia Evaluation  Patient identified by MRN, date of birth, ID band Patient awake    Reviewed: Allergy & Precautions, Patient's Chart, lab work & pertinent test results  History of Anesthesia Complications Negative for: history of anesthetic complications  Airway Mallampati: II  TM Distance: >3 FB Neck ROM: Full    Dental no notable dental hx.    Pulmonary neg pulmonary ROS,    Pulmonary exam normal        Cardiovascular negative cardio ROS Normal cardiovascular exam     Neuro/Psych  Headaches, Multiple sclerosis negative psych ROS   GI/Hepatic negative GI ROS, Neg liver ROS,   Endo/Other  negative endocrine ROS  Renal/GU negative Renal ROS  negative genitourinary   Musculoskeletal negative musculoskeletal ROS (+)   Abdominal   Peds  Hematology negative hematology ROS (+)   Anesthesia Other Findings Day of surgery medications reviewed with patient.  Reproductive/Obstetrics (+) Pregnancy                             Anesthesia Physical Anesthesia Plan  ASA: II  Anesthesia Plan: Epidural   Post-op Pain Management:    Induction:   PONV Risk Score and Plan: Treatment may vary due to age or medical condition  Airway Management Planned: Natural Airway  Additional Equipment:   Intra-op Plan:   Post-operative Plan:   Informed Consent: I have reviewed the patients History and Physical, chart, labs and discussed the procedure including the risks, benefits and alternatives for the proposed anesthesia with the patient or authorized representative who has indicated his/her understanding and acceptance.       Plan Discussed with:   Anesthesia Plan Comments:         Anesthesia Quick Evaluation

## 2019-07-23 NOTE — Anesthesia Procedure Notes (Signed)
Epidural Patient location during procedure: OB Start time: 07/23/2019 8:12 AM End time: 07/23/2019 8:15 AM  Staffing Anesthesiologist: Brennan Bailey, MD Performed: anesthesiologist   Preanesthetic Checklist Completed: patient identified, pre-op evaluation, timeout performed, IV checked, risks and benefits discussed and monitors and equipment checked  Epidural Patient position: sitting Prep: site prepped and draped and DuraPrep Patient monitoring: continuous pulse ox, blood pressure and heart rate Approach: midline Location: L3-L4 Injection technique: LOR air  Needle:  Needle type: Tuohy  Needle gauge: 17 G Needle length: 9 cm Needle insertion depth: 6 cm Catheter type: closed end flexible Catheter size: 19 Gauge Catheter at skin depth: 11 cm Test dose: negative and Other (1% lidocaine)  Assessment Events: blood not aspirated, injection not painful, no injection resistance, negative IV test and no paresthesia  Additional Notes Patient identified. Risks, benefits, and alternatives discussed with patient including but not limited to bleeding, infection, nerve damage, paralysis, failed block, incomplete pain control, headache, blood pressure changes, nausea, vomiting, reactions to medication, itching, and postpartum back pain. Confirmed with bedside nurse the patient's most recent platelet count. Confirmed with patient that they are not currently taking any anticoagulation, have any bleeding history, or any family history of bleeding disorders. Patient expressed understanding and wished to proceed. All questions were answered. Sterile technique was used throughout the entire procedure. Please see nursing notes for vital signs.   Crisp LOR on first pass. Test dose was given through epidural catheter and negative prior to continuing to dose epidural or start infusion. Warning signs of high block given to the patient including shortness of breath, tingling/numbness in hands, complete  motor block, or any concerning symptoms with instructions to call for help. Patient was given instructions on fall risk and not to get out of bed. All questions and concerns addressed with instructions to call with any issues or inadequate analgesia.  Reason for block:procedure for pain

## 2019-07-24 LAB — CBC
HCT: 35.6 % — ABNORMAL LOW (ref 36.0–46.0)
Hemoglobin: 12.4 g/dL (ref 12.0–15.0)
MCH: 34.2 pg — ABNORMAL HIGH (ref 26.0–34.0)
MCHC: 34.8 g/dL (ref 30.0–36.0)
MCV: 98.1 fL (ref 80.0–100.0)
Platelets: 165 10*3/uL (ref 150–400)
RBC: 3.63 MIL/uL — ABNORMAL LOW (ref 3.87–5.11)
RDW: 13.4 % (ref 11.5–15.5)
WBC: 12.9 10*3/uL — ABNORMAL HIGH (ref 4.0–10.5)
nRBC: 0 % (ref 0.0–0.2)

## 2019-07-25 NOTE — Discharge Summary (Signed)
Obstetric Discharge Summary Reason for Admission: induction of labor Prenatal Procedures: none Intrapartum Procedures: spontaneous vaginal delivery Postpartum Procedures: none Complications-Operative and Postpartum: none Hemoglobin  Date Value Ref Range Status  07/24/2019 12.4 12.0 - 15.0 g/dL Final   HCT  Date Value Ref Range Status  07/24/2019 35.6 (L) 36.0 - 46.0 % Final    Physical Exam:  General: alert, cooperative and appears stated age 33: appropriate Uterine Fundus: firm Incision: healing well, no significant drainage, no dehiscence DVT Evaluation: No evidence of DVT seen on physical exam.  Discharge Diagnoses: Term Pregnancy-delivered  Discharge Information: Date: 07/25/2019 Activity: pelvic rest Diet: routine Medications: None Condition: improved Instructions: refer to practice specific booklet Discharge to: home   Newborn Data: Live born female  Birth Weight: 8 lb 0.8 oz (3650 g) APGAR: 8, 9  Newborn Delivery   Birth date/time: 07/23/2019 12:30:00 Delivery type: Vaginal, Spontaneous      Home with mother.  Linda Griffith 07/25/2019, 8:25 AM

## 2019-08-10 ENCOUNTER — Inpatient Hospital Stay (HOSPITAL_COMMUNITY): Payer: BC Managed Care – PPO

## 2019-08-10 ENCOUNTER — Inpatient Hospital Stay (HOSPITAL_COMMUNITY)
Admission: EM | Admit: 2019-08-10 | Discharge: 2019-08-13 | DRG: 769 | Disposition: A | Discharge status: Home / Self Care | Payer: BC Managed Care – PPO | Attending: Surgery | Admitting: Surgery

## 2019-08-10 DIAGNOSIS — Z880 Allergy status to penicillin: Secondary | ICD-10-CM

## 2019-08-10 DIAGNOSIS — O9963 Diseases of the digestive system complicating the puerperium: Principal | ICD-10-CM | POA: Diagnosis present

## 2019-08-10 DIAGNOSIS — Z79899 Other long term (current) drug therapy: Secondary | ICD-10-CM

## 2019-08-10 DIAGNOSIS — E669 Obesity, unspecified: Secondary | ICD-10-CM | POA: Diagnosis present

## 2019-08-10 DIAGNOSIS — O99215 Obesity complicating the puerperium: Secondary | ICD-10-CM | POA: Diagnosis present

## 2019-08-10 DIAGNOSIS — K819 Cholecystitis, unspecified: Secondary | ICD-10-CM | POA: Diagnosis not present

## 2019-08-10 DIAGNOSIS — Z800 Family history of malignant neoplasm of digestive organs: Secondary | ICD-10-CM

## 2019-08-10 DIAGNOSIS — K8062 Calculus of gallbladder and bile duct with acute cholecystitis without obstruction: Secondary | ICD-10-CM | POA: Diagnosis present

## 2019-08-10 DIAGNOSIS — Z20828 Contact with and (suspected) exposure to other viral communicable diseases: Secondary | ICD-10-CM | POA: Diagnosis present

## 2019-08-10 DIAGNOSIS — G35 Multiple sclerosis: Secondary | ICD-10-CM | POA: Diagnosis present

## 2019-08-10 DIAGNOSIS — O99355 Diseases of the nervous system complicating the puerperium: Secondary | ICD-10-CM | POA: Diagnosis present

## 2019-08-10 DIAGNOSIS — R1011 Right upper quadrant pain: Secondary | ICD-10-CM

## 2019-08-10 DIAGNOSIS — K8050 Calculus of bile duct without cholangitis or cholecystitis without obstruction: Secondary | ICD-10-CM

## 2019-08-10 DIAGNOSIS — Z419 Encounter for procedure for purposes other than remedying health state, unspecified: Secondary | ICD-10-CM

## 2019-08-10 LAB — CBC
HCT: 42.2 % (ref 36.0–46.0)
Hemoglobin: 14.6 g/dL (ref 12.0–15.0)
MCH: 32.7 pg (ref 26.0–34.0)
MCHC: 34.6 g/dL (ref 30.0–36.0)
MCV: 94.4 fL (ref 80.0–100.0)
Platelets: 264 10*3/uL (ref 150–400)
RBC: 4.47 MIL/uL (ref 3.87–5.11)
RDW: 11.6 % (ref 11.5–15.5)
WBC: 10.7 10*3/uL — ABNORMAL HIGH (ref 4.0–10.5)
nRBC: 0 % (ref 0.0–0.2)

## 2019-08-10 LAB — I-STAT BETA HCG BLOOD, ED (MC, WL, AP ONLY): I-stat hCG, quantitative: <5 m[IU]/mL (ref <5)

## 2019-08-10 LAB — COMPREHENSIVE METABOLIC PANEL
ALT: 74 U/L — ABNORMAL HIGH (ref 0–44)
AST: 87 U/L — ABNORMAL HIGH (ref 15–41)
Albumin: 4.1 g/dL (ref 3.5–5.0)
Alkaline Phosphatase: 120 U/L (ref 38–126)
Anion gap: 13 (ref 5–15)
BUN: 12 mg/dL (ref 6–20)
CO2: 22 mmol/L (ref 22–32)
Calcium: 9.7 mg/dL (ref 8.9–10.3)
Chloride: 102 mmol/L (ref 98–111)
Creatinine, Ser: 0.92 mg/dL (ref 0.44–1.00)
GFR calc Af Amer: >60 mL/min (ref >60)
GFR calc non Af Amer: >60 mL/min (ref >60)
Glucose, Bld: 101 mg/dL — ABNORMAL HIGH (ref 70–99)
Potassium: 3.8 mmol/L (ref 3.5–5.1)
Sodium: 137 mmol/L (ref 135–145)
Total Bilirubin: 2 mg/dL — ABNORMAL HIGH (ref 0.3–1.2)
Total Protein: 7.3 g/dL (ref 6.5–8.1)

## 2019-08-10 LAB — LIPASE, BLOOD: Lipase: 31 U/L (ref 11–51)

## 2019-08-10 MED ORDER — ACETAMINOPHEN 500 MG PO TABS
1000.0000 mg | ORAL_TABLET | Freq: Once | ORAL | Status: AC
  Administered 2019-08-10: 1000 mg via ORAL
  Filled 2019-08-10: qty 2

## 2019-08-10 MED ORDER — SODIUM CHLORIDE 0.9% FLUSH: 3.0000 mL | Freq: Once | INTRAVENOUS | Status: DC

## 2019-08-10 NOTE — ED Notes (Signed)
pts husband called upset about pt having to pump and having nowhere to store her milk, and stated she is in a lot of pain

## 2019-08-10 NOTE — ED Triage Notes (Signed)
Pt here from home with c/o right upper quadrant pain that  Started today , pt still has her gall bladder , pt states that it is worse after spicy or greasy food

## 2019-08-11 ENCOUNTER — Encounter (HOSPITAL_COMMUNITY): Admission: EM | Disposition: A | Discharge status: Home / Self Care | Payer: Self-pay | Source: Home / Self Care

## 2019-08-11 ENCOUNTER — Observation Stay (HOSPITAL_COMMUNITY): Payer: BC Managed Care – PPO | Admitting: Anesthesiology

## 2019-08-11 ENCOUNTER — Encounter (HOSPITAL_COMMUNITY): Payer: Self-pay

## 2019-08-11 ENCOUNTER — Observation Stay (HOSPITAL_COMMUNITY): Payer: BC Managed Care – PPO

## 2019-08-11 ENCOUNTER — Other Ambulatory Visit: Payer: Self-pay

## 2019-08-11 DIAGNOSIS — K819 Cholecystitis, unspecified: Secondary | ICD-10-CM | POA: Diagnosis present

## 2019-08-11 HISTORY — PX: CHOLECYSTECTOMY: SHX55

## 2019-08-11 LAB — COMPREHENSIVE METABOLIC PANEL
ALT: 211 U/L — ABNORMAL HIGH (ref 0–44)
AST: 213 U/L — ABNORMAL HIGH (ref 15–41)
Albumin: 3.9 g/dL (ref 3.5–5.0)
Alkaline Phosphatase: 168 U/L — ABNORMAL HIGH (ref 38–126)
Anion gap: 13 (ref 5–15)
BUN: 9 mg/dL (ref 6–20)
CO2: 24 mmol/L (ref 22–32)
Calcium: 9.2 mg/dL (ref 8.9–10.3)
Chloride: 101 mmol/L (ref 98–111)
Creatinine, Ser: 0.84 mg/dL (ref 0.44–1.00)
GFR calc Af Amer: >60 mL/min (ref >60)
GFR calc non Af Amer: >60 mL/min (ref >60)
Glucose, Bld: 89 mg/dL (ref 70–99)
Potassium: 3.7 mmol/L (ref 3.5–5.1)
Sodium: 138 mmol/L (ref 135–145)
Total Bilirubin: 2 mg/dL — ABNORMAL HIGH (ref 0.3–1.2)
Total Protein: 6.8 g/dL (ref 6.5–8.1)

## 2019-08-11 LAB — CBC
HCT: 43.1 % (ref 36.0–46.0)
Hemoglobin: 14.6 g/dL (ref 12.0–15.0)
MCH: 33.1 pg (ref 26.0–34.0)
MCHC: 33.9 g/dL (ref 30.0–36.0)
MCV: 97.7 fL (ref 80.0–100.0)
Platelets: 248 10*3/uL (ref 150–400)
RBC: 4.41 MIL/uL (ref 3.87–5.11)
RDW: 11.8 % (ref 11.5–15.5)
WBC: 7.3 10*3/uL (ref 4.0–10.5)
nRBC: 0 % (ref 0.0–0.2)

## 2019-08-11 LAB — SURGICAL PCR SCREEN
MRSA, PCR: NEGATIVE
Staphylococcus aureus: NEGATIVE

## 2019-08-11 LAB — URINALYSIS, ROUTINE W REFLEX MICROSCOPIC
Bilirubin Urine: NEGATIVE
Glucose, UA: NEGATIVE mg/dL
Ketones, ur: 20 mg/dL — AB
Nitrite: NEGATIVE
Protein, ur: NEGATIVE mg/dL
Specific Gravity, Urine: 1.021 (ref 1.005–1.030)
pH: 6 (ref 5.0–8.0)

## 2019-08-11 LAB — HIV ANTIBODY (ROUTINE TESTING W REFLEX): HIV Screen 4th Generation wRfx: NONREACTIVE

## 2019-08-11 LAB — SARS CORONAVIRUS 2 (TAT 6-24 HRS): SARS Coronavirus 2: NEGATIVE

## 2019-08-11 SURGERY — LAPAROSCOPIC CHOLECYSTECTOMY WITH INTRAOPERATIVE CHOLANGIOGRAM
Anesthesia: General

## 2019-08-11 MED ORDER — SUCCINYLCHOLINE CHLORIDE 200 MG/10ML IV SOSY
PREFILLED_SYRINGE | INTRAVENOUS | Status: DC | PRN
  Administered 2019-08-11: 120 mg via INTRAVENOUS

## 2019-08-11 MED ORDER — CIPROFLOXACIN IN D5W 400 MG/200ML IV SOLN
400.0000 mg | Freq: Two times a day (BID) | INTRAVENOUS | Status: DC
  Administered 2019-08-11: 400 mg via INTRAVENOUS
  Filled 2019-08-11: qty 200

## 2019-08-11 MED ORDER — SUGAMMADEX SODIUM 200 MG/2ML IV SOLN
INTRAVENOUS | Status: DC | PRN
  Administered 2019-08-11: 238 mg via INTRAVENOUS

## 2019-08-11 MED ORDER — DEXAMETHASONE SODIUM PHOSPHATE 10 MG/ML IJ SOLN
INTRAMUSCULAR | Status: DC | PRN
  Administered 2019-08-11: 5 mg via INTRAVENOUS

## 2019-08-11 MED ORDER — SODIUM CHLORIDE 0.9 % IR SOLN
Status: DC | PRN
  Administered 2019-08-11: 1000 mL

## 2019-08-11 MED ORDER — PROMETHAZINE HCL 25 MG/ML IJ SOLN: 6.2500 mg | INTRAMUSCULAR | Status: DC | PRN

## 2019-08-11 MED ORDER — SODIUM CHLORIDE 0.9 % IV SOLN
INTRAVENOUS | Status: DC | PRN
  Administered 2019-08-11: 20 mL

## 2019-08-11 MED ORDER — ONDANSETRON HCL 4 MG/2ML IJ SOLN: 4.0000 mg | Freq: Four times a day (QID) | INTRAMUSCULAR | Status: DC | PRN

## 2019-08-11 MED ORDER — OXYCODONE HCL 5 MG PO TABS
ORAL_TABLET | ORAL | Status: AC
  Filled 2019-08-11: qty 1

## 2019-08-11 MED ORDER — MORPHINE SULFATE (PF) 4 MG/ML IV SOLN
4.0000 mg | Freq: Once | INTRAVENOUS | Status: AC
  Administered 2019-08-11: 4 mg via INTRAVENOUS
  Filled 2019-08-11: qty 1

## 2019-08-11 MED ORDER — LIDOCAINE 2% (20 MG/ML) 5 ML SYRINGE
INTRAMUSCULAR | Status: AC
  Filled 2019-08-11: qty 5

## 2019-08-11 MED ORDER — OXYCODONE HCL 5 MG PO TABS: 5.0000 mg | ORAL_TABLET | ORAL | Status: DC | PRN

## 2019-08-11 MED ORDER — 0.9 % SODIUM CHLORIDE (POUR BTL) OPTIME
TOPICAL | Status: DC | PRN
  Administered 2019-08-11: 1000 mL

## 2019-08-11 MED ORDER — LIDOCAINE 2% (20 MG/ML) 5 ML SYRINGE
INTRAMUSCULAR | Status: DC | PRN
  Administered 2019-08-11: 60 mg via INTRAVENOUS

## 2019-08-11 MED ORDER — OXYCODONE HCL 5 MG/5ML PO SOLN: 5.0000 mg | Freq: Once | ORAL | Status: AC | PRN

## 2019-08-11 MED ORDER — LACTATED RINGERS IV SOLN
INTRAVENOUS | Status: DC
  Administered 2019-08-11 (×2): via INTRAVENOUS

## 2019-08-11 MED ORDER — DIPHENHYDRAMINE HCL 50 MG/ML IJ SOLN
INTRAMUSCULAR | Status: AC
  Filled 2019-08-11: qty 1

## 2019-08-11 MED ORDER — FENTANYL CITRATE (PF) 100 MCG/2ML IJ SOLN
25.0000 ug | INTRAMUSCULAR | Status: DC | PRN
  Administered 2019-08-11 (×2): 50 ug via INTRAVENOUS

## 2019-08-11 MED ORDER — ONDANSETRON HCL 4 MG/2ML IJ SOLN
4.0000 mg | Freq: Once | INTRAMUSCULAR | Status: AC
  Administered 2019-08-11: 4 mg via INTRAVENOUS
  Filled 2019-08-11: qty 2

## 2019-08-11 MED ORDER — ACETAMINOPHEN 500 MG PO TABS
1000.0000 mg | ORAL_TABLET | Freq: Three times a day (TID) | ORAL | Status: DC
  Administered 2019-08-11 – 2019-08-13 (×8): 1000 mg via ORAL
  Filled 2019-08-11 (×8): qty 2

## 2019-08-11 MED ORDER — SCOPOLAMINE 1 MG/3DAYS TD PT72
1.0000 | MEDICATED_PATCH | Freq: Once | TRANSDERMAL | Status: DC
  Administered 2019-08-11: 1.5 mg via TRANSDERMAL
  Filled 2019-08-11: qty 1

## 2019-08-11 MED ORDER — PRENATAL MULTIVITAMIN CH
1.0000 | ORAL_TABLET | Freq: Every day | ORAL | Status: DC
  Administered 2019-08-11 – 2019-08-12 (×2): 1 via ORAL
  Filled 2019-08-11 (×3): qty 1

## 2019-08-11 MED ORDER — FENTANYL CITRATE (PF) 250 MCG/5ML IJ SOLN
INTRAMUSCULAR | Status: AC
  Filled 2019-08-11: qty 5

## 2019-08-11 MED ORDER — SODIUM CHLORIDE 0.9 % IV SOLN
1.0000 g | Freq: Once | INTRAVENOUS | Status: AC
  Administered 2019-08-11: 1 g via INTRAVENOUS
  Filled 2019-08-11: qty 10

## 2019-08-11 MED ORDER — PANTOPRAZOLE SODIUM 40 MG IV SOLR
40.0000 mg | Freq: Every day | INTRAVENOUS | Status: DC
  Administered 2019-08-11 – 2019-08-12 (×2): 40 mg via INTRAVENOUS
  Filled 2019-08-11 (×2): qty 40

## 2019-08-11 MED ORDER — ACETAMINOPHEN 500 MG PO TABS
1000.0000 mg | ORAL_TABLET | Freq: Once | ORAL | Status: AC
  Administered 2019-08-11: 1000 mg via ORAL
  Filled 2019-08-11: qty 2

## 2019-08-11 MED ORDER — FENTANYL CITRATE (PF) 100 MCG/2ML IJ SOLN
INTRAMUSCULAR | Status: AC
  Filled 2019-08-11: qty 2

## 2019-08-11 MED ORDER — PROPOFOL 10 MG/ML IV BOLUS
INTRAVENOUS | Status: DC | PRN
  Administered 2019-08-11 (×2): 100 mg via INTRAVENOUS

## 2019-08-11 MED ORDER — BUPIVACAINE HCL (PF) 0.25 % IJ SOLN
INTRAMUSCULAR | Status: DC | PRN
  Administered 2019-08-11: 12 mL

## 2019-08-11 MED ORDER — ENOXAPARIN SODIUM 40 MG/0.4ML ~~LOC~~ SOLN
40.0000 mg | SUBCUTANEOUS | Status: DC
  Administered 2019-08-11: 40 mg via SUBCUTANEOUS
  Filled 2019-08-11: qty 0.4

## 2019-08-11 MED ORDER — KCL IN DEXTROSE-NACL 20-5-0.45 MEQ/L-%-% IV SOLN
INTRAVENOUS | Status: DC
  Administered 2019-08-11 – 2019-08-13 (×4): via INTRAVENOUS
  Filled 2019-08-11 (×5): qty 1000

## 2019-08-11 MED ORDER — SIMETHICONE 80 MG PO CHEW
40.0000 mg | CHEWABLE_TABLET | Freq: Four times a day (QID) | ORAL | Status: DC | PRN
  Filled 2019-08-11: qty 1

## 2019-08-11 MED ORDER — ONDANSETRON HCL 4 MG/2ML IJ SOLN
INTRAMUSCULAR | Status: DC | PRN
  Administered 2019-08-11: 4 mg via INTRAVENOUS

## 2019-08-11 MED ORDER — ONDANSETRON 4 MG PO TBDP: 4.0000 mg | ORAL_TABLET | Freq: Four times a day (QID) | ORAL | Status: DC | PRN

## 2019-08-11 MED ORDER — MORPHINE SULFATE (PF) 2 MG/ML IV SOLN
1.0000 mg | INTRAVENOUS | Status: DC | PRN
  Administered 2019-08-11 – 2019-08-12 (×5): 1 mg via INTRAVENOUS
  Filled 2019-08-11 (×5): qty 1

## 2019-08-11 MED ORDER — BUPIVACAINE HCL (PF) 0.25 % IJ SOLN
INTRAMUSCULAR | Status: AC
  Filled 2019-08-11: qty 30

## 2019-08-11 MED ORDER — DIPHENHYDRAMINE HCL 50 MG/ML IJ SOLN
INTRAMUSCULAR | Status: DC | PRN
  Administered 2019-08-11: 12.5 mg via INTRAVENOUS

## 2019-08-11 MED ORDER — DEXAMETHASONE SODIUM PHOSPHATE 10 MG/ML IJ SOLN
INTRAMUSCULAR | Status: AC
  Filled 2019-08-11: qty 1

## 2019-08-11 MED ORDER — STERILE WATER FOR IRRIGATION IR SOLN
Status: DC | PRN
  Administered 2019-08-11: 1000 mL

## 2019-08-11 MED ORDER — MORPHINE SULFATE (PF) 2 MG/ML IV SOLN: 1.0000 mg | INTRAVENOUS | Status: DC | PRN

## 2019-08-11 MED ORDER — DIPHENHYDRAMINE HCL 50 MG/ML IJ SOLN: 12.5000 mg | Freq: Four times a day (QID) | INTRAMUSCULAR | Status: DC | PRN

## 2019-08-11 MED ORDER — ROCURONIUM BROMIDE 10 MG/ML (PF) SYRINGE
PREFILLED_SYRINGE | INTRAVENOUS | Status: AC
  Filled 2019-08-11: qty 10

## 2019-08-11 MED ORDER — FENTANYL CITRATE (PF) 250 MCG/5ML IJ SOLN
INTRAMUSCULAR | Status: DC | PRN
  Administered 2019-08-11 (×2): 50 ug via INTRAVENOUS
  Administered 2019-08-11: 150 ug via INTRAVENOUS

## 2019-08-11 MED ORDER — PROPOFOL 10 MG/ML IV BOLUS
INTRAVENOUS | Status: AC
  Filled 2019-08-11: qty 20

## 2019-08-11 MED ORDER — MIDAZOLAM HCL 2 MG/2ML IJ SOLN
INTRAMUSCULAR | Status: AC
  Filled 2019-08-11: qty 2

## 2019-08-11 MED ORDER — DIPHENHYDRAMINE HCL 12.5 MG/5ML PO ELIX: 12.5000 mg | ORAL_SOLUTION | Freq: Four times a day (QID) | ORAL | Status: DC | PRN

## 2019-08-11 MED ORDER — ROCURONIUM BROMIDE 10 MG/ML (PF) SYRINGE
PREFILLED_SYRINGE | INTRAVENOUS | Status: DC | PRN
  Administered 2019-08-11: 40 mg via INTRAVENOUS

## 2019-08-11 MED ORDER — ONDANSETRON HCL 4 MG/2ML IJ SOLN
INTRAMUSCULAR | Status: AC
  Filled 2019-08-11: qty 2

## 2019-08-11 MED ORDER — OXYCODONE HCL 5 MG PO TABS
5.0000 mg | ORAL_TABLET | Freq: Once | ORAL | Status: AC | PRN
  Administered 2019-08-11: 5 mg via ORAL

## 2019-08-11 SURGICAL SUPPLY — 46 items
ADH SKN CLS APL DERMABOND .7 (GAUZE/BANDAGES/DRESSINGS) ×1
APL PRP STRL LF DISP 70% ISPRP (MISCELLANEOUS) ×1
APPLIER CLIP ROT 10 11.4 M/L (STAPLE) ×2
APR CLP MED LRG 11.4X10 (STAPLE) ×1
BAG SPEC RTRVL 10 TROC 200 (ENDOMECHANICALS) ×1
BLADE CLIPPER SURG (BLADE) IMPLANT
CANISTER SUCT 3000ML PPV (MISCELLANEOUS) ×2 IMPLANT
CHLORAPREP W/TINT 26 (MISCELLANEOUS) ×2 IMPLANT
CLIP APPLIE ROT 10 11.4 M/L (STAPLE) ×1 IMPLANT
COVER MAYO STAND STRL (DRAPES) ×2 IMPLANT
COVER SURGICAL LIGHT HANDLE (MISCELLANEOUS) ×2 IMPLANT
COVER WAND RF STERILE (DRAPES) ×2 IMPLANT
DERMABOND ADVANCED (GAUZE/BANDAGES/DRESSINGS) ×1
DERMABOND ADVANCED .7 DNX12 (GAUZE/BANDAGES/DRESSINGS) ×1 IMPLANT
DRAPE C-ARM 42X120 X-RAY (DRAPES) ×2 IMPLANT
DRAPE WARM FLUID 44X44 (DRAPES) ×2 IMPLANT
ELECT REM PT RETURN 9FT ADLT (ELECTROSURGICAL) ×2
ELECTRODE REM PT RTRN 9FT ADLT (ELECTROSURGICAL) ×1 IMPLANT
GLOVE BIO SURGEON STRL SZ8 (GLOVE) ×2 IMPLANT
GLOVE BIOGEL PI IND STRL 8 (GLOVE) ×1 IMPLANT
GLOVE BIOGEL PI INDICATOR 8 (GLOVE) ×1
GOWN STRL REUS W/ TWL LRG LVL3 (GOWN DISPOSABLE) ×2 IMPLANT
GOWN STRL REUS W/ TWL XL LVL3 (GOWN DISPOSABLE) ×1 IMPLANT
GOWN STRL REUS W/TWL LRG LVL3 (GOWN DISPOSABLE) ×6
GOWN STRL REUS W/TWL XL LVL3 (GOWN DISPOSABLE) ×2
KIT BASIN OR (CUSTOM PROCEDURE TRAY) ×2 IMPLANT
KIT TURNOVER KIT B (KITS) ×2 IMPLANT
NS IRRIG 1000ML POUR BTL (IV SOLUTION) ×2 IMPLANT
PAD ARMBOARD 7.5X6 YLW CONV (MISCELLANEOUS) ×2 IMPLANT
POUCH RETRIEVAL ECOSAC 10 (ENDOMECHANICALS) ×1 IMPLANT
POUCH RETRIEVAL ECOSAC 10MM (ENDOMECHANICALS) ×1
SCISSORS LAP 5X35 DISP (ENDOMECHANICALS) ×2 IMPLANT
SET CHOLANGIOGRAPH 5 50 .035 (SET/KITS/TRAYS/PACK) ×2 IMPLANT
SET IRRIG TUBING LAPAROSCOPIC (IRRIGATION / IRRIGATOR) ×2 IMPLANT
SET TUBE SMOKE EVAC HIGH FLOW (TUBING) ×2 IMPLANT
SLEEVE ENDOPATH XCEL 5M (ENDOMECHANICALS) ×2 IMPLANT
SPECIMEN JAR SMALL (MISCELLANEOUS) ×2 IMPLANT
SUT MNCRL AB 4-0 PS2 18 (SUTURE) ×2 IMPLANT
TOWEL GREEN STERILE (TOWEL DISPOSABLE) ×2 IMPLANT
TOWEL GREEN STERILE FF (TOWEL DISPOSABLE) ×2 IMPLANT
TRAY LAPAROSCOPIC MC (CUSTOM PROCEDURE TRAY) ×2 IMPLANT
TROCAR XCEL BLUNT TIP 100MML (ENDOMECHANICALS) ×2 IMPLANT
TROCAR XCEL NON-BLD 11X100MML (ENDOMECHANICALS) ×2 IMPLANT
TROCAR XCEL NON-BLD 5MMX100MML (ENDOMECHANICALS) ×2 IMPLANT
WARMER LAPAROSCOPE (MISCELLANEOUS) ×1 IMPLANT
WATER STERILE IRR 1000ML POUR (IV SOLUTION) ×2 IMPLANT

## 2019-08-11 NOTE — H&P (Signed)
Linda Griffith is an 33 y.o. female.   Chief Complaint: abdominal pain HPI: 33 year old female who recently delivered 2 weeks ago comes in with acute onset of right upper quadrant pain starting on Monday.  The pain did not get better so she came to the emergency room and had an 11-hour wait.  She had nausea vomiting bloating.  She reports several instances like this during her pregnancy about 5 months ago.  There is a cluster of 3 events about 5 months ago but the pain was not nearly as intense nor lasted as long as this episode.  Since having her child 2 weeks ago she reports 3 additional events that are more severe than her pregnancy events.  She denies any fevers or chills.  She denies any emesis.  She denies any chest pain, chest pressure, shortness of breath or peripheral edema.  She denies any prior abdominal surgery.  She is breast-feeding.  She does not smoke.  Denies tobacco or alcohol use  She had a mild leukocytosis last night.  Past Medical History:  Diagnosis Date   Joint pain    knee, and lower leg   Migraine without aura    Multiple sclerosis (HCC) 03/2014   Muscle spasms of lower extremity    Paresthesias 02/02/2014   Sinusitis    Sprain of thoracic region    Thoracic region    Past Surgical History:  Procedure Laterality Date   NO PAST SURGERIES      Family History  Problem Relation Age of Onset   Colon cancer Mother    Hypertension Brother    Social History:  reports that she has never smoked. She has never used smokeless tobacco. She reports previous alcohol use. She reports that she does not use drugs.  Allergies:  Allergies  Allergen Reactions   Amoxicillin Rash   Penicillins Hives and Rash    Did it involve swelling of the face/tongue/throat, SOB, or low BP? Yes Did it involve sudden or severe rash/hives, skin peeling, or any reaction on the inside of your mouth or nose? Yes Did you need to seek medical attention at a hospital or doctor's  office? Yes When did it last happen? If all above answers are NO, may proceed with cephalosporin use.    (Not in a hospital admission)   Results for orders placed or performed during the hospital encounter of 08/10/19 (from the past 48 hour(s))  Lipase, blood     Status: None   Collection Time: 08/10/19  5:19 PM  Result Value Ref Range   Lipase 31 11 - 51 U/L    Comment: Performed at [facility], [address], [place], [place] [address]  Comprehensive metabolic panel     Status: Abnormal   Collection Time: 08/10/19  5:19 PM  Result Value Ref Range   Sodium 137 135 - 145 mmol/L   Potassium 3.8 3.5 - 5.1 mmol/L   Chloride 102 98 - 111 mmol/L   CO2 22 22 - 32 mmol/L   Glucose, Bld 101 (H) 70 - 99 mg/dL   BUN 12 6 - 20 mg/dL   Creatinine, Ser 6.75 0.44 - 1.00 mg/dL   Calcium 9.7 8.9 - 91.6 mg/dL   Total Protein 7.3 6.5 - 8.1 g/dL   Albumin 4.1 3.5 - 5.0 g/dL   AST 87 (H) 15 - 41 U/L   ALT 74 (H) 0 - 44 U/L   Alkaline Phosphatase 120 38 - 126 U/L   Total  Bilirubin 2.0 (H) 0.3 - 1.2 mg/dL   GFR calc non Af Amer >60 >60 mL/min   GFR calc Af Amer >60 >60 mL/min   Anion gap 13 5 - 15    Comment: Performed at [facility] [address] [place], [place] [address]  CBC     Status: Abnormal   Collection Time: 08/10/19  5:19 PM  Result Value Ref Range   WBC 10.7 (H) 4.0 - 10.5 K/uL   RBC 4.47 3.87 - 5.11 MIL/uL   Hemoglobin 14.6 12.0 - 15.0 g/dL   HCT 42.2 36.0 - 46.0 %   MCV 94.4 80.0 - 100.0 fL   MCH 32.7 26.0 - 34.0 pg   MCHC 34.6 30.0 - 36.0 g/dL   RDW 11.6 11.5 - 15.5 %   Platelets 264 150 - 400 K/uL   nRBC 0.0 0.0 - 0.2 %    Comment: Performed at [facility], [place] [address], [place], [place] [address]  I-Stat beta hCG blood, ED     Status: None   Collection Time: 08/10/19  5:28 PM  Result Value Ref Range   I-stat hCG, quantitative <5.0 <5 mIU/mL   Comment 3            Comment:   GEST. AGE      CONC.  (mIU/mL)   <=1 WEEK        5 - 50      2 WEEKS       50 - 500     3 WEEKS       100 - 10,000     4 WEEKS     1,000 - 30,000        FEMALE AND NON-PREGNANT FEMALE:     LESS THAN 5 mIU/mL   Urinalysis, Routine w reflex microscopic     Status: Abnormal   Collection Time: 08/11/19  1:44 AM  Result Value Ref Range   Color, Urine AMBER (A) YELLOW    Comment: BIOCHEMICALS MAY BE AFFECTED BY COLOR   APPearance CLEAR CLEAR   Specific Gravity, Urine 1.021 1.005 - 1.030   pH 6.0 5.0 - 8.0   Glucose, UA NEGATIVE NEGATIVE mg/dL   Hgb urine dipstick MODERATE (A) NEGATIVE   Bilirubin Urine NEGATIVE NEGATIVE   Ketones, ur 20 (A) NEGATIVE mg/dL   Protein, ur NEGATIVE NEGATIVE mg/dL   Nitrite NEGATIVE NEGATIVE   Leukocytes,Ua LARGE (A) NEGATIVE   RBC / HPF 21-50 0 - 5 RBC/hpf   WBC, UA 21-50 0 - 5 WBC/hpf   Bacteria, UA FEW (A) NONE SEEN   Squamous Epithelial / LPF 0-5 0 - 5   Mucus PRESENT    Hyaline Casts, UA PRESENT     Comment: Performed at [facility], [address], [place], [place] [address]  SARS CORONAVIRUS 2 (TAT 6-24 HRS) Nasopharyngeal Nasopharyngeal Swab     Status: None   Collection Time: 08/11/19  1:47 AM   Specimen: Nasopharyngeal Swab  Result Value Ref Range   SARS Coronavirus 2 NEGATIVE NEGATIVE    Comment: (NOTE) SARS-CoV-2 target nucleic acids are NOT DETECTED. The SARS-CoV-2 RNA is generally detectable in upper and lower respiratory specimens during the acute phase of infection. Negative results do not preclude SARS-CoV-2 infection, do not rule out co-infections with other pathogens, and should not be used as the sole basis for treatment or other patient management decisions. Negative results must be combined with clinical observations, patient history, and epidemiological information.  The expected result is Negative. Fact Sheet for Patients: HairSlick.no Fact Sheet for Healthcare Providers: quierodirigir.com This test is not yet approved or  cleared by the [place] FDA and  has been authorized for detection and/or diagnosis of SARS-CoV-2 by FDA under an Emergency Use Authorization (EUA). This EUA will remain  in effect (meaning this test can be used) for the duration of the COVID-19 declaration under Section 56 4(b)(1) of the Act, 21 U.S.C. section 360bbb-3(b)(1), unless the authorization is terminated or revoked sooner. Performed at [facility], [address] [place], [place] [address]   Comprehensive metabolic panel     Status: Abnormal   Collection Time: 08/11/19  5:02 AM  Result Value Ref Range   Sodium 138 135 - 145 mmol/L   Potassium 3.7 3.5 - 5.1 mmol/L   Chloride 101 98 - 111 mmol/L   CO2 24 22 - 32 mmol/L   Glucose, Bld 89 70 - 99 mg/dL   BUN 9 6 - 20 mg/dL   Creatinine, Ser 8.09 0.44 - 1.00 mg/dL   Calcium 9.2 8.9 - 98.3 mg/dL   Total Protein 6.8 6.5 - 8.1 g/dL   Albumin 3.9 3.5 - 5.0 g/dL   AST 382 (H) 15 - 41 U/L   ALT 211 (H) 0 - 44 U/L   Alkaline Phosphatase 168 (H) 38 - 126 U/L   Total Bilirubin 2.0 (H) 0.3 - 1.2 mg/dL   GFR calc non Af Amer >60 >60 mL/min   GFR calc Af Amer >60 >60 mL/min   Anion gap 13 5 - 15    Comment: Performed at [facility], [address], [place], [place] [address]  CBC     Status: None   Collection Time: 08/11/19  5:02 AM  Result Value Ref Range   WBC 7.3 4.0 - 10.5 K/uL   RBC 4.41 3.87 - 5.11 MIL/uL   Hemoglobin 14.6 12.0 - 15.0 g/dL   HCT 76.7 34.1 - 93.7 %   MCV 97.7 80.0 - 100.0 fL   MCH 33.1 26.0 - 34.0 pg   MCHC 33.9 30.0 - 36.0 g/dL   RDW 90.2 40.9 - 73.5 %   Platelets 248 150 - 400 K/uL   nRBC 0.0 0.0 - 0.2 %    Comment: Performed at [facility], [address], [place], [place] [address]   US Abdomen Limited Ruq  Result Date: 08/10/2019 CLINICAL DATA:  Right upper quadrant pain for 1 day. EXAM: ULTRASOUND ABDOMEN LIMITED RIGHT UPPER QUADRANT COMPARISON:  None. FINDINGS: Gallbladder: Extensive sludge in the dependent gallbladder  with small stones. Gallbladder wall at 3 mm. Positive reported sonographic Murphy's sign. No signs of pericholecystic fluid. Common bile duct: Diameter: 5 mm Liver: No focal lesion identified. Within normal limits in parenchymal echogenicity. Portal vein is patent on color Doppler imaging with normal direction of blood flow towards the liver. Other: None. IMPRESSION: 1. Findings which could be seen in the setting of early cholecystitis. Correlation with HIDA scan may be useful given report of sonographic Murphy's and abundant sludge and small stones. Electronically Signed   By: Donzetta Kohut M.D.   On: 08/10/2019 18:57    Review of Systems  Gastrointestinal: Positive for abdominal pain and nausea.  All other systems reviewed and are negative.   Blood pressure 120/80, pulse 75, temperature 98.3 F (36.8 C), temperature source Oral, resp. rate 18, height 5\' 10"  (1.778 m), weight 119 kg, last menstrual period 10/24/2018, SpO2 94 %,  unknown if currently breastfeeding. Physical Exam  Vitals reviewed. Constitutional: She is oriented to person, place, and time. She appears well-developed and well-nourished. No distress.  HENT:  Head: Normocephalic and atraumatic.  Right Ear: External ear normal.  Left Ear: External ear normal.  Eyes: Conjunctivae are normal. No scleral icterus.  Neck: Normal range of motion. Neck supple. No tracheal deviation present. No thyromegaly present.  Cardiovascular: Normal rate and normal heart sounds.  Respiratory: Effort normal and breath sounds normal. No stridor. No respiratory distress. She has no wheezes.  GI: Soft. She exhibits no distension. There is abdominal tenderness (mild) in the right upper quadrant. There is no rebound and no guarding.    Musculoskeletal:        General: No tenderness or edema.  Lymphadenopathy:    She has no cervical adenopathy.  Neurological: She is alert and oriented to person, place, and time. She exhibits normal muscle tone.  Skin:  Skin is warm and dry. No rash noted. She is not diaphoretic. No erythema. No pallor.  Psychiatric: She has a normal mood and affect. Her behavior is normal. Judgment and thought content normal.     Assessment/Plan Obesity Acute calculus cholecystitis  I believe the patient's symptoms are consistent with gallbladder disease.  We discussed gallbladder disease. The patient was given Agricultural engineereducational material. We discussed non-operative and operative management. We discussed the signs & symptoms of acute cholecystitis  I discussed laparoscopic cholecystectomy with IOC in detail.  The patient was given  diagrams detailing the procedure.  We discussed the risks and benefits of a laparoscopic cholecystectomy including, but not limited to bleeding, infection, injury to surrounding structures such as the intestine or liver, bile leak, retained gallstones, need to convert to an open procedure, prolonged diarrhea, blood clots such as  DVT, common bile duct injury, anesthesia risks, and possible need for additional procedures.  We discussed the typical post-operative recovery course. I explained that the likelihood of improvement of their symptoms is good.  We also discussed pumping and dumping while taking pain medicine  Plan is laparoscopic cholecystectomy with possible angiogram later today with Dr. Luisa Hartornett  Continue IV antibiotic Scheduled Tylenol  Mary SellaEric M. Andrey CampanileWilson, MD, FACS General, Bariatric, & Minimally Invasive Surgery [facility] [place]    Gaynelle AduEric Azaylea Maves, MD 08/11/2019, 7:02 AM

## 2019-08-11 NOTE — ED Notes (Signed)
Surgeon at the bedside.

## 2019-08-11 NOTE — Transfer of Care (Signed)
Immediate Anesthesia Transfer of Care Note  Patient: Linda Griffith  Procedure(s) Performed: LAPAROSCOPIC CHOLECYSTECTOMY WITH INTRAOPERATIVE CHOLANGIOGRAM (N/A )  Patient Location: PACU  Anesthesia Type:General  Level of Consciousness: drowsy and patient cooperative  Airway & Oxygen Therapy: Patient Spontanous Breathing and Patient connected to face mask oxygen  Post-op Assessment: Report given to RN and Post -op Vital signs reviewed and stable  Post vital signs: Reviewed and stable  Last Vitals:  Vitals Value Taken Time  BP 155/86 08/11/19 1608  Temp 36.8 C 08/11/19 1608  Pulse 97 08/11/19 1608  Resp 11 08/11/19 1608  SpO2 98 % 08/11/19 1608    Last Pain:  Vitals:   08/11/19 0813  TempSrc:   PainSc: 0-No pain         Complications: No apparent anesthesia complications

## 2019-08-11 NOTE — Lactation Note (Addendum)
Lactation Consultation Note Took mom DEBP and kit. When [place] entered mom's room, mom was using hand pump. Reviewed how to use DEBP, set up cleaning and milk storage. Gave mom bottles, soap, and basin for cleaning pump parts. Baby is 17 days old. 2nd baby. Mom states BF going well.  Patient Name: Linda Griffith Today's Date: 08/11/2019     Maternal Data    Feeding    LATCH Score                   Interventions    Lactation Tools Discussed/Used DEBP     Consult Status      Tumeka Chimenti, Elta [place] 08/11/2019, 2:59 AM

## 2019-08-11 NOTE — Anesthesia Procedure Notes (Signed)
Procedure Name: Intubation Date/Time: 08/11/2019 3:01 PM Performed by: Kathryne Hitch, CRNA Pre-anesthesia Checklist: Patient identified, Emergency Drugs available, Suction available and Patient being monitored Patient Re-evaluated:Patient Re-evaluated prior to induction Oxygen Delivery Method: Circle system utilized Preoxygenation: Pre-oxygenation with 100% oxygen Induction Type: IV induction Ventilation: Mask ventilation without difficulty Laryngoscope Size: Miller and 3 Grade View: Grade I Tube type: Oral Tube size: 7.0 mm Number of attempts: 1 Airway Equipment and Method: Stylet and Oral airway Placement Confirmation: ETT inserted through vocal cords under direct vision,  positive ETCO2 and breath sounds checked- equal and bilateral Secured at: 22 cm Tube secured with: Tape Dental Injury: Teeth and Oropharynx as per pre-operative assessment

## 2019-08-11 NOTE — Op Note (Signed)
Laparoscopic Cholecystectomy with IOC Procedure Note  Indications: This patient presents with symptomatic gallbladder disease and will undergo laparoscopic cholecystectomy. The procedure has been discussed with the patient. Operative and non operative treatments have been discussed. Risks of surgery include bleeding, infection,  Common bile duct injury,  Injury to the stomach,liver, colon,small intestine, abdominal wall,  Diaphragm,  Major blood vessels,  And the need for an open procedure.  Other risks include worsening of medical problems, death,  DVT and pulmonary embolism, and cardiovascular events.   Medical options have also been discussed. The patient has been informed of long term expectations of surgery and non surgical options,  The patient agrees to proceed.    Pre-operative Diagnosis: Calculus of gallbladder with acute cholecystitis, without mention of obstruction  Post-operative Diagnosis: Calculus of bile duct with acute cholecystitis without mention of obstruction  Surgeon: Dortha Schwalbe MD  Assistants: Lavell Anchors PA C   Anesthesia: General endotracheal anesthesia and Local anesthesia 0.5% bupivacaine  ASA Class: 2  Procedure Details  The patient was seen again in the Holding Room. The risks, benefits, complications, treatment options, and expected outcomes were discussed with the patient. The possibilities of reaction to medication, pulmonary aspiration, perforation of viscus, bleeding, recurrent infection, finding a normal gallbladder, the need for additional procedures, failure to diagnose a condition, the possible need to convert to an open procedure, and creating a complication requiring transfusion or operation were discussed with the patient. The patient and/or family concurred with the proposed plan, giving informed consent. The site of surgery properly noted/marked. The patient was taken to Operating Room, identified as Linda Griffith and the procedure verified as  Laparoscopic Cholecystectomy with Intraoperative Cholangiograms. A Time Out was held and the above information confirmed.  Prior to the induction of general anesthesia, antibiotic prophylaxis was administered. General endotracheal anesthesia was then administered and tolerated well. After the induction, the abdomen was prepped in the usual sterile fashion. The patient was positioned in the supine position with the left arm comfortably tucked, along with some reverse Trendelenburg.  Local anesthetic agent was injected into the skin near the umbilicus and an incision made. The midline fascia was incised and the Hasson technique was used to introduce a 12 mm port under direct vision. It was secured with a figure of eight Vicryl suture placed in the usual fashion. Pneumoperitoneum was then created with CO2 and tolerated well without any adverse changes in the patient's vital signs. Additional trocars were introduced under direct vision with an 11 mm trocar in the epigastrium and 2 5 mm trocars in the right upper quadrant. All skin incisions were infiltrated with a local anesthetic agent before making the incision and placing the trocars.   The gallbladder was identified, the fundus grasped and retracted cephalad. Adhesions were lysed bluntly and with the electrocautery where indicated, taking care not to injure any adjacent organs or viscus. The infundibulum was grasped and retracted laterally, exposing the peritoneum overlying the triangle of Calot. This was then divided and exposed in a blunt fashion. The cystic duct was clearly identified and bluntly dissected circumferentially. The junctions of the gallbladder, cystic duct and common bile duct were clearly identified prior to the division of any linear structure.   An incision was made in the cystic duct and the cholangiogram catheter introduced. The catheter was secured using an endoclip. The study showed possible stones and good visualization of the distal  and proximal biliary tree. There was no obstruction but 2 small filling defects in  the distal CBD .  The catheter was then removed.   The cystic duct was then  ligated with surgical clips  on the patient side and  clipped on the gallbladder side and divided. The cystic artery was identified, dissected free, ligated with clips and divided as well. Posterior cystic artery clipped and divided.  The gallbladder was dissected from the liver bed in retrograde fashion with the electrocautery. The gallbladder was removed. The liver bed was irrigated and inspected. Hemostasis was achieved with the electrocautery. Copious irrigation was utilized and was repeatedly aspirated until clear all particulate matter. Hemostasis was achieved with no signs  Of bleeding or bile leakage.  Pneumoperitoneum was completely reduced after viewing removal of the trocars under direct vision. The wound was thoroughly irrigated and the fascia was then closed with a figure of eight suture; the skin was then closed with 4 0 MONOCRYL  and a sterile dressing of Dermabond  was applied.  Instrument, sponge, and needle counts were correct at closure and at the conclusion of the case.   Findings: Cholecystitis with Cholelithiasis  Estimated Blood Loss: less than 50 mL         Drains: none          Total IV Fluids: per record          Specimens: Gallbladder           Complications: None; patient tolerated the procedure well.         Disposition: PACU - hemodynamically stable.         Condition: stable

## 2019-08-11 NOTE — Anesthesia Postprocedure Evaluation (Signed)
Anesthesia Post Note  Patient: Linda Griffith  Procedure(s) Performed: LAPAROSCOPIC CHOLECYSTECTOMY WITH INTRAOPERATIVE CHOLANGIOGRAM (N/A )     Patient location during evaluation: PACU Anesthesia Type: General Level of consciousness: awake and alert Pain management: pain level controlled Vital Signs Assessment: post-procedure vital signs reviewed and stable Respiratory status: spontaneous breathing, nonlabored ventilation, respiratory function stable and patient connected to nasal cannula oxygen Cardiovascular status: blood pressure returned to baseline and stable Postop Assessment: no apparent nausea or vomiting Anesthetic complications: no    Last Vitals:  Vitals:   08/11/19 1638 08/11/19 1658  BP: (!) 156/91 (!) 158/94  Pulse: 90 93  Resp: 10 16  Temp: 36.9 C 36.5 C  SpO2: 97% 98%    Last Pain:  Vitals:   08/11/19 1658  TempSrc: Oral  PainSc:                  [name] [name]

## 2019-08-11 NOTE — Anesthesia Preprocedure Evaluation (Addendum)
Anesthesia Evaluation  Patient identified by MRN, date of birth, ID band Patient awake    Reviewed: Allergy & Precautions, NPO status , Patient's Chart, lab work & pertinent test results  History of Anesthesia Complications Negative for: history of anesthetic complications  Airway Mallampati: II  TM Distance: >3 FB Neck ROM: Full    Dental no notable dental hx.    Pulmonary neg pulmonary ROS,    Pulmonary exam normal        Cardiovascular negative cardio ROS Normal cardiovascular exam     Neuro/Psych  Headaches, Multiple sclerosis negative psych ROS   GI/Hepatic Neg liver ROS, GERD  Medicated and Controlled,Acute cholecystitis   Endo/Other  Obese, BMI 38  Renal/GU negative Renal ROS  negative genitourinary   Musculoskeletal negative musculoskeletal ROS (+)   Abdominal   Peds  Hematology negative hematology ROS (+)   Anesthesia Other Findings Day of surgery medications reviewed with patient.  Reproductive/Obstetrics negative OB ROS                            Anesthesia Physical Anesthesia Plan  ASA: II and emergent  Anesthesia Plan: General   Post-op Pain Management:    Induction: Intravenous, Rapid sequence and Cricoid pressure planned  PONV Risk Score and Plan: 4 or greater and Treatment may vary due to age or medical condition, Ondansetron, Dexamethasone, Midazolam and Scopolamine patch - Pre-op  Airway Management Planned: Oral ETT  Additional Equipment: None  Intra-op Plan:   Post-operative Plan: Extubation in OR  Informed Consent: I have reviewed the patients History and Physical, chart, labs and discussed the procedure including the risks, benefits and alternatives for the proposed anesthesia with the patient or authorized representative who has indicated his/her understanding and acceptance.     Dental advisory given  Plan Discussed with: CRNA  Anesthesia Plan  Comments:        Anesthesia Quick Evaluation

## 2019-08-11 NOTE — ED Provider Notes (Signed)
[facility]   CSN: [id number] Arrival date & time: 08/10/19  1651     History   Chief Complaint Chief Complaint  Patient presents with   Abdominal Pain    HPI Linda Griffith is a 33 y.o. female.     HPI   This is a 33 year old female with a history of MS and is currently 17 days postpartum who presents with abdominal pain.  Patient reports that she had a normal spontaneous vaginal delivery on 10/22.  During her pregnancy she had intermittent right upper quadrant pain.  However since she has delivered she has had worsening right upper quadrant pain.  She currently rates her pain at 7 out of 10.  It is worse after eating especially after eating sausage.  It is sharp and she is also had some back pain.  She has noted some loose stools.  She has also had some nausea and vomiting.  Patient is currently breast-feeding and has only taken Tylenol for her pain.  She has not noted any fevers.  Denies chest pain, shortness of breath, cough.  Past Medical History:  Diagnosis Date   Joint pain    knee, and lower leg   Migraine without aura    Multiple sclerosis (HCC) 03/2014   Muscle spasms of lower extremity    Paresthesias 02/02/2014   Sinusitis    Sprain of thoracic region    Thoracic region    Patient Active Problem List   Diagnosis Date Noted   Cholecystitis 08/11/2019   Encounter for induction of labor 07/23/2019   Preeclampsia 04/29/2015   Multiple sclerosis (HCC) 03/2014   Paresthesias 02/02/2014    Past Surgical History:  Procedure Laterality Date   NO PAST SURGERIES       OB History    Gravida  3   Para  2   Term  2   Preterm      AB  1   Living  2     SAB  1   TAB      Ectopic      Multiple  0   Live Births  2            Home Medications    Prior to Admission medications   Medication Sig Start Date End Date Taking? Authorizing Provider  pantoprazole (PROTONIX) 40 MG  tablet Take 40 mg by mouth every evening. 06/22/19   [provider]  Prenatal Vit-Fe Fumarate-FA (PRENATAL MULTIVITAMIN) TABS tablet Take 1 tablet by mouth at bedtime.    [provider]    Family History Family History  Problem Relation Age of Onset   Colon cancer Mother    Hypertension Brother     Social History Social History   Tobacco Use   Smoking status: Never Smoker   Smokeless tobacco: Never Used  Substance Use Topics   Alcohol use: Not Currently    Comment: occ 3-6 drinks per week   Drug use: No     Allergies   Amoxicillin and Penicillins   Review of Systems Review of Systems  Constitutional: Negative for fever.  Respiratory: Negative for shortness of breath.   Cardiovascular: Negative for chest pain.  Gastrointestinal: Positive for abdominal pain, diarrhea, nausea and vomiting. Negative for blood in stool.  Genitourinary: Negative for dysuria.  All other systems reviewed and are negative.    Physical Exam Updated Vital Signs BP 129/86 (BP Location: Left Arm)    Pulse 92  Temp 98.3 F (36.8 C) (Oral)    Resp 18    Ht 1.778 m (5\' 10" )    Wt 119 kg    LMP 10/24/2018    SpO2 99%    BMI 37.65 kg/m   Physical Exam Vitals signs and nursing note reviewed.  Constitutional:      Appearance: She is well-developed. She is obese.  HENT:     Head: Normocephalic and atraumatic.  Eyes:     Pupils: Pupils are equal, round, and reactive to light.  Neck:     Musculoskeletal: Neck supple.  Cardiovascular:     Rate and Rhythm: Normal rate and regular rhythm.     Heart sounds: Normal heart sounds.  Pulmonary:     Effort: Pulmonary effort is normal. No respiratory distress.     Breath sounds: No wheezing.  Abdominal:     General: Bowel sounds are normal.     Palpations: Abdomen is soft.     Tenderness: There is abdominal tenderness in the right upper quadrant. There is no guarding or rebound. Positive signs include Murphy's sign.  Skin:     General: Skin is warm and dry.  Neurological:     Mental Status: She is alert and oriented to person, place, and time.  Psychiatric:        Mood and Affect: Mood normal.      ED Treatments / Results  Labs (all labs ordered are listed, but only abnormal results are displayed) Labs Reviewed  COMPREHENSIVE METABOLIC PANEL - Abnormal; Notable for the following components:      Result Value   Glucose, Bld 101 (*)    AST 87 (*)    ALT 74 (*)    Total Bilirubin 2.0 (*)    All other components within normal limits  CBC - Abnormal; Notable for the following components:   WBC 10.7 (*)    All other components within normal limits  SARS CORONAVIRUS 2 (TAT 6-24 HRS)  LIPASE, BLOOD  URINALYSIS, ROUTINE W REFLEX MICROSCOPIC  I-STAT BETA HCG BLOOD, ED ([facility], [facility], [facility] ONLY)    EKG None  Radiology 10/26/2018 Abdomen Limited Ruq  Result Date: 08/10/2019 CLINICAL DATA:  Right upper quadrant pain for 1 day. EXAM: ULTRASOUND ABDOMEN LIMITED RIGHT UPPER QUADRANT COMPARISON:  None. FINDINGS: Gallbladder: Extensive sludge in the dependent gallbladder with small stones. Gallbladder wall at 3 mm. Positive reported sonographic Murphy's sign. No signs of pericholecystic fluid. Common bile duct: Diameter: 5 mm Liver: No focal lesion identified. Within normal limits in parenchymal echogenicity. Portal vein is patent on color Doppler imaging with normal direction of blood flow towards the liver. Other: None. IMPRESSION: 1. Findings which could be seen in the setting of early cholecystitis. Correlation with HIDA scan may be useful given report of sonographic Murphy's and abundant sludge and small stones. Electronically Signed   By: 13/06/2019 M.D.   On: 08/10/2019 18:57    Procedures Procedures (including critical care time)  Medications Ordered in ED Medications  sodium chloride flush (NS) 0.9 % injection 3 mL (3 mLs Intravenous Not Given 08/11/19 0051)  cefTRIAXone (ROCEPHIN) 1 g in sodium chloride 0.9 % 100  mL IVPB (has no administration in time range)  acetaminophen (TYLENOL) tablet 1,000 mg (1,000 mg Oral Given 08/10/19 1720)  morphine 4 MG/ML injection 4 mg (4 mg Intravenous Given 08/11/19 0140)  ondansetron (ZOFRAN) injection 4 mg (4 mg Intravenous Given 08/11/19 0140)     Initial Impression / Assessment and Plan / ED  Course  I have reviewed the triage vital signs and the nursing notes.  Pertinent labs & imaging results that were available during my care of the patient were reviewed by me and considered in my medical decision making (see chart for details).        Patient presents with right upper quadrant pain.  She is currently 17 days postpartum.  She is nontoxic-appearing and vital signs are reassuring.  She is afebrile.  She is tenderness in the right upper quadrant and history is highly suggestive of cholelithiasis versus cholecystitis.  Patient was given pain and nausea medication.  Lab work reviewed.  She has slight elevation in AST and ALT as well as bilirubin.  He has a leukocytosis to 10.7.  Right upper quadrant ultrasound obtained and shows extensive sludge in the gallbladder with small stones.  Gallbladder wall is 3 mm.  Positive sonographic Murphy's.  No pericholecystic fluid.  Recommend possible HIDA scan although findings are consistent for early cholecystitis.  I feel that clinically given this ultrasound and her lab work-up, she likely has early cholecystitis.  Will discuss with general surgery.  1:48 AM Discussed with Dr. Redmond Pulling.  Agrees with assessment.  He will plan for admission.  Will give 1 g of Rocephin and Covid testing is pending.  Patient does have a history of penicillin allergy and is unsure whether she has tolerated cephalosporins in the past.  Feel she is low risk and will monitor.  Final Clinical Impressions(s) / ED Diagnoses   Final diagnoses:  Cholecystitis    ED Discharge Orders    None       Keirah Konitzer, Barbette Hair, MD 08/11/19 [id number]

## 2019-08-11 NOTE — Discharge Instructions (Signed)
[facility] ° °Please arrive at least 30 min before your appointment to complete your check in paperwork.  If you are unable to arrive 30 min prior to your appointment time we may have to cancel or reschedule you. °LAPAROSCOPIC SURGERY: POST OP INSTRUCTIONS °Always review your discharge instruction sheet given to you by the facility where your surgery was performed. °IF YOU HAVE DISABILITY OR FAMILY LEAVE FORMS, YOU MUST BRING THEM TO THE OFFICE FOR PROCESSING.   °DO NOT GIVE THEM TO YOUR DOCTOR. ° °PAIN CONTROL ° °1. First take acetaminophen (Tylenol) AND/or ibuprofen (Advil) to control your pain after surgery.  Follow directions on package.  Taking acetaminophen (Tylenol) and/or ibuprofen (Advil) regularly after surgery will help to control your pain and lower the amount of prescription pain medication you may need.  You should not take more than 4,000 mg (4 grams) of acetaminophen (Tylenol) in 24 hours.  You should not take ibuprofen (Advil), aleve, motrin, naprosyn or other NSAIDS if you have a history of stomach ulcers or chronic kidney disease.  °2. A prescription for pain medication may be given to you upon discharge.  Take your pain medication as prescribed, if you still have uncontrolled pain after taking acetaminophen (Tylenol) or ibuprofen (Advil). °3. Use ice packs to help control pain. °4. If you need a refill on your pain medication, please contact your pharmacy.  They will contact our office to request authorization. Prescriptions will not be filled after 5pm or on week-ends. ° °HOME MEDICATIONS °5. Take your usually prescribed medications unless otherwise directed. ° °DIET °6. You should follow a light diet the first few days after arrival home.  Be sure to include lots of fluids daily. Avoid fatty, fried foods.  ° °CONSTIPATION °7. It is common to experience some constipation after surgery and if you are taking pain medication.  Increasing fluid intake and taking a stool  softener (such as Colace) will usually help or prevent this problem from occurring.  A mild laxative (Milk of Magnesia or Miralax) should be taken according to package instructions if there are no bowel movements after 48 hours. ° °WOUND/INCISION CARE °8. Most patients will experience some swelling and bruising in the area of the incisions.  Ice packs will help.  Swelling and bruising can take several days to resolve.  °9. Unless discharge instructions indicate otherwise, follow guidelines below  °a. STERI-STRIPS - you may remove your outer bandages 48 hours after surgery, and you may shower at that time.  You have steri-strips (small skin tapes) in place directly over the incision.  These strips should be left on the skin for 7-10 days.   °b. DERMABOND/SKIN GLUE - you may shower in 24 hours.  The glue will flake off over the next 2-3 weeks. °10. Any sutures or staples will be removed at the office during your follow-up visit. ° °ACTIVITIES °11. You may resume regular (light) daily activities beginning the next day--such as daily self-care, walking, climbing stairs--gradually increasing activities as tolerated.  You may have sexual intercourse when it is comfortable.  Refrain from any heavy lifting or straining until approved by your doctor. °a. You may drive when you are no longer taking prescription pain medication, you can comfortably wear a seatbelt, and you can safely maneuver your car and apply brakes. ° °FOLLOW-UP °12. You should see your doctor in the office for a follow-up appointment approximately 2-3 weeks after your surgery.  You should have been given your post-op/follow-up appointment when   your surgery was scheduled.  If you did not receive a post-op/follow-up appointment, make sure that you call for this appointment within a day or two after you arrive home to insure a convenient appointment time. ° °OTHER INSTRUCTIONS ° °WHEN TO CALL YOUR DOCTOR: °1. Fever over 101.0 °2. Inability to  urinate °3. Continued bleeding from incision. °4. Increased pain, redness, or drainage from the incision. °5. Increasing abdominal pain ° °The clinic staff is available to answer your questions during regular business hours.  Please don’t hesitate to call and ask to speak to one of the nurses for clinical concerns.  If you have a medical emergency, go to the nearest emergency room or call 911.  A surgeon from [facility] is always on call at the hospital. °[address], [address], [place], [place]  [address] ? [address] [address], [place], [place]   [address] °[phone] ? [phone] ? FAX [phone] ° ° ° °

## 2019-08-12 ENCOUNTER — Encounter (HOSPITAL_COMMUNITY): Payer: Self-pay | Admitting: Surgery

## 2019-08-12 DIAGNOSIS — K8050 Calculus of bile duct without cholangitis or cholecystitis without obstruction: Secondary | ICD-10-CM

## 2019-08-12 DIAGNOSIS — Z79899 Other long term (current) drug therapy: Secondary | ICD-10-CM | POA: Diagnosis not present

## 2019-08-12 DIAGNOSIS — R945 Abnormal results of liver function studies: Secondary | ICD-10-CM

## 2019-08-12 DIAGNOSIS — K8062 Calculus of gallbladder and bile duct with acute cholecystitis without obstruction: Secondary | ICD-10-CM | POA: Diagnosis present

## 2019-08-12 DIAGNOSIS — Z800 Family history of malignant neoplasm of digestive organs: Secondary | ICD-10-CM | POA: Diagnosis not present

## 2019-08-12 DIAGNOSIS — Z880 Allergy status to penicillin: Secondary | ICD-10-CM | POA: Diagnosis not present

## 2019-08-12 DIAGNOSIS — E669 Obesity, unspecified: Secondary | ICD-10-CM | POA: Diagnosis present

## 2019-08-12 DIAGNOSIS — O9963 Diseases of the digestive system complicating the puerperium: Secondary | ICD-10-CM | POA: Diagnosis present

## 2019-08-12 DIAGNOSIS — Z20828 Contact with and (suspected) exposure to other viral communicable diseases: Secondary | ICD-10-CM | POA: Diagnosis present

## 2019-08-12 DIAGNOSIS — O99355 Diseases of the nervous system complicating the puerperium: Secondary | ICD-10-CM | POA: Diagnosis present

## 2019-08-12 DIAGNOSIS — K819 Cholecystitis, unspecified: Secondary | ICD-10-CM | POA: Diagnosis present

## 2019-08-12 DIAGNOSIS — O99215 Obesity complicating the puerperium: Secondary | ICD-10-CM | POA: Diagnosis present

## 2019-08-12 DIAGNOSIS — G35 Multiple sclerosis: Secondary | ICD-10-CM | POA: Diagnosis present

## 2019-08-12 LAB — COMPREHENSIVE METABOLIC PANEL
ALT: 158 U/L — ABNORMAL HIGH (ref 0–44)
AST: 82 U/L — ABNORMAL HIGH (ref 15–41)
Albumin: 3.5 g/dL (ref 3.5–5.0)
Alkaline Phosphatase: 141 U/L — ABNORMAL HIGH (ref 38–126)
Anion gap: 10 (ref 5–15)
BUN: 6 mg/dL (ref 6–20)
CO2: 21 mmol/L — ABNORMAL LOW (ref 22–32)
Calcium: 8.8 mg/dL — ABNORMAL LOW (ref 8.9–10.3)
Chloride: 106 mmol/L (ref 98–111)
Creatinine, Ser: 0.77 mg/dL (ref 0.44–1.00)
GFR calc Af Amer: >60 mL/min (ref >60)
GFR calc non Af Amer: >60 mL/min (ref >60)
Glucose, Bld: 90 mg/dL (ref 70–99)
Potassium: 4.3 mmol/L (ref 3.5–5.1)
Sodium: 137 mmol/L (ref 135–145)
Total Bilirubin: 1.1 mg/dL (ref 0.3–1.2)
Total Protein: 6.2 g/dL — ABNORMAL LOW (ref 6.5–8.1)

## 2019-08-12 LAB — CBC
HCT: 38.3 % (ref 36.0–46.0)
Hemoglobin: 12.8 g/dL (ref 12.0–15.0)
MCH: 32.6 pg (ref 26.0–34.0)
MCHC: 33.4 g/dL (ref 30.0–36.0)
MCV: 97.5 fL (ref 80.0–100.0)
Platelets: 226 10*3/uL (ref 150–400)
RBC: 3.93 MIL/uL (ref 3.87–5.11)
RDW: 11.8 % (ref 11.5–15.5)
WBC: 7.3 10*3/uL (ref 4.0–10.5)
nRBC: 0 % (ref 0.0–0.2)

## 2019-08-12 NOTE — Consult Note (Addendum)
Referring Provider:  Dr. Luisa Hartornett, [facility] Primary Care Physician:  Patient, No Pcp Per Primary Gastroenterologist:  Gentry FitzUnassigned  Reason for Consultation:  Positive IOC  HPI: Linda Griffith is a 33 y.o. female who is 2 weeks post-partum.  Had lap chole on 11/10 for what they called acalculous cholecystitis, had positive IOC.  Ultrasound on admission showed the following:  IMPRESSION: 1. Findings which could be seen in the setting of early cholecystitis. Correlation with HIDA scan may be useful given report of sonographic Murphy's and abundant sludge and small stones.  LFT's trending down this AM actually with AST 82, ALT 158, ALP 141, and total bili 1.1.  Past Medical History:  Diagnosis Date   Joint pain    knee, and lower leg   Migraine without aura    Multiple sclerosis (HCC) 03/2014   Muscle spasms of lower extremity    Paresthesias 02/02/2014   Sinusitis    Sprain of thoracic region    Thoracic region    Past Surgical History:  Procedure Laterality Date   CHOLECYSTECTOMY N/A 08/11/2019   Procedure: LAPAROSCOPIC CHOLECYSTECTOMY WITH INTRAOPERATIVE CHOLANGIOGRAM;  Surgeon: Harriette Bouillonornett, Thomas, MD;  Location: [facility] OR;  Service: General;  Laterality: N/A;   NO PAST SURGERIES      Prior to Admission medications   Medication Sig Start Date End Date Taking? Authorizing Provider  pantoprazole (PROTONIX) 40 MG tablet Take 40 mg by mouth every evening. 06/22/19  Yes [provider]  Prenatal Vit-Fe Fumarate-FA (PRENATAL MULTIVITAMIN) TABS tablet Take 1 tablet by mouth at bedtime.   Yes [provider]    Current Facility-Administered Medications  Medication Dose Route Frequency Provider Last Rate Last Dose   acetaminophen (TYLENOL) tablet 1,000 mg  1,000 mg Oral Q8H Meuth, Brooke A, PA-C   1,000 mg at 08/12/19 0709   dextrose 5 % and 0.45 % NaCl with KCl 20 mEq/L infusion   Intravenous Continuous Meuth, Brooke A, PA-C 75 mL/hr at 08/12/19 0539      diphenhydrAMINE (BENADRYL) 12.5 MG/5ML elixir 12.5 mg  12.5 mg Oral Q6H PRN Meuth, Brooke A, PA-C       Or   diphenhydrAMINE (BENADRYL) injection 12.5 mg  12.5 mg Intravenous Q6H PRN Meuth, Brooke A, PA-C       enoxaparin (LOVENOX) injection 40 mg  40 mg Subcutaneous Q24H Meuth, Brooke A, PA-C   40 mg at 08/11/19 2124   lactated ringers infusion   Intravenous Continuous Meuth, Brooke A, PA-C 10 mL/hr at 08/11/19 1347     morphine 2 MG/ML injection 1-2 mg  1-2 mg Intravenous Q2H PRN Meuth, Brooke A, PA-C   1 mg at 08/12/19 0923   ondansetron (ZOFRAN-ODT) disintegrating tablet 4 mg  4 mg Oral Q6H PRN Meuth, Brooke A, PA-C       Or   ondansetron (ZOFRAN) injection 4 mg  4 mg Intravenous Q6H PRN Meuth, Brooke A, PA-C       oxyCODONE (Oxy IR/ROXICODONE) immediate release tablet 5-10 mg  5-10 mg Oral Q4H PRN Meuth, Brooke A, PA-C       pantoprazole (PROTONIX) injection 40 mg  40 mg Intravenous QHS Meuth, Brooke A, PA-C   40 mg at 08/11/19 2125   prenatal multivitamin tablet 1 tablet  1 tablet Oral QHS Meuth, Brooke A, PA-C   1 tablet at 08/11/19 2123   simethicone (MYLICON) chewable tablet 40 mg  40 mg Oral Q6H PRN Meuth, Brooke A, PA-C       sodium chloride flush (  NS) 0.9 % injection 3 mL  3 mL Intravenous Once Meuth, Brooke A, PA-C        Allergies as of 08/10/2019 - Review Complete 07/23/2019  Allergen Reaction Noted   Amoxicillin Rash 02/01/2014   Penicillins Hives and Rash 02/01/2014    Family History  Problem Relation Age of Onset   Colon cancer Mother    Hypertension Brother     Social History   Socioeconomic History   Marital status: Married    Spouse name: Not on file   Number of children: Not on file   Years of education: Not on file   Highest education level: Not on file  Occupational History   Not on file  Social Needs   Financial resource strain: Not on file   Food insecurity    Worry: Not on file    Inability: Not on file   Transportation  needs    Medical: Not on file    Non-medical: Not on file  Tobacco Use   Smoking status: Never Smoker   Smokeless tobacco: Never Used  Substance and Sexual Activity   Alcohol use: Not Currently    Comment: occ 3-6 drinks per week   Drug use: No   Sexual activity: Yes  Lifestyle   Physical activity    Days per week: Not on file    Minutes per session: Not on file   Stress: Not on file  Relationships   Social connections    Talks on phone: Not on file    Gets together: Not on file    Attends religious service: Not on file    Active member of club or organization: Not on file    Attends meetings of clubs or organizations: Not on file    Relationship status: Not on file   Intimate partner violence    Fear of current or ex partner: Not on file    Emotionally abused: Not on file    Physically abused: Not on file    Forced sexual activity: Not on file  Other Topics Concern   Not on file  Social History Narrative   Married, no children   Right handed   Some college   1 cup daily    Review of Systems: ROS is O/W negative except as mentioned in HPI.  Physical Exam: Vital signs in last 24 hours: Temp:  [97.7 F (36.5 C)-98.5 F (36.9 C)] 98.3 F (36.8 C) (11/11 0446) Pulse Rate:  [75-97] 81 (11/11 0446) Resp:  [10-18] 18 (11/11 0446) BP: (116-158)/(57-94) 118/68 (11/11 0446) SpO2:  [97 %-98 %] 97 % (11/11 0446)   General:  Alert, Well-developed, well-nourished, pleasant and cooperative in NAD Head:  Normocephalic and atraumatic. Eyes:  Sclera clear, no icterus.  Conjunctiva pink. Ears:  Normal auditory acuity. Mouth:  No deformity or lesions.   Lungs:  Clear throughout to auscultation.  No wheezes, crackles, or rhonchi.  Heart:  Regular rate and rhythm; no murmurs, clicks, rubs, or gallops. Abdomen:  Soft, non-distended.  BS present.  Appropriate TTP.  Msk:  Symmetrical without gross deformities. Pulses:  Normal pulses noted. Extremities:  Without clubbing  or edema. Neurologic:  Alert and oriented x 4;  grossly normal neurologically. Skin:  Intact without significant lesions or rashes. Psych:  Alert and cooperative. Normal mood and affect.  Intake/Output from previous day: 11/10 0701 - 11/11 0700 In: 2119.3 [I.V.:2102.4; IV Piggyback:16.8] Out: 50 [Blood:50]  Lab Results: Recent Labs    08/10/19 1719 08/11/19 0502 08/12/19  0511  WBC 10.7* 7.3 7.3  HGB 14.6 14.6 12.8  HCT 42.2 43.1 38.3  PLT 264 248 226   BMET Recent Labs    08/10/19 1719 08/11/19 0502 08/12/19 0511  NA 137 138 137  K 3.8 3.7 4.3  CL 102 101 106  CO2 22 24 21*  GLUCOSE 101* 89 90  BUN 12 9 6   CREATININE 0.92 0.84 0.77  CALCIUM 9.7 9.2 8.8*   LFT Recent Labs    08/12/19 0511  PROT 6.2*  ALBUMIN 3.5  AST 82*  ALT 158*  ALKPHOS 141*  BILITOT 1.1   Studies/Results: Dg Cholangiogram Operative  Result Date: 08/11/2019 CLINICAL DATA:  33 year old female with a history of cholecystitis EXAM: INTRAOPERATIVE CHOLANGIOGRAM TECHNIQUE: Cholangiographic images from the C-arm fluoroscopic device were submitted for interpretation post-operatively. Please see the procedural report for the amount of contrast and the fluoroscopy time utilized. COMPARISON:  None. FINDINGS: Surgical instruments project over the upper abdomen. There is cannulation of the cystic duct/gallbladder neck, with antegrade infusion of contrast. Caliber of the extrahepatic ductal system within normal limits. Vague filling defects at the distal common bile duct above the ampulla, which are partially obstructing. Contrast does enter the duodenum through the ampulla. IMPRESSION: Intraoperative cholangiogram demonstrates filling defects in the common bile duct above the ampulla, concerning for choledocholithiasis. Correlation with ERCP may be indicated. Please refer to the dictated operative report for full details of intraoperative findings and procedure Electronically Signed   By: 32 D.O.    On: 08/11/2019 15:46   13/07/2019 Abdomen Limited Ruq  Result Date: 08/10/2019 CLINICAL DATA:  Right upper quadrant pain for 1 day. EXAM: ULTRASOUND ABDOMEN LIMITED RIGHT UPPER QUADRANT COMPARISON:  None. FINDINGS: Gallbladder: Extensive sludge in the dependent gallbladder with small stones. Gallbladder wall at 3 mm. Positive reported sonographic Murphy's sign. No signs of pericholecystic fluid. Common bile duct: Diameter: 5 mm Liver: No focal lesion identified. Within normal limits in parenchymal echogenicity. Portal vein is patent on color Doppler imaging with normal direction of blood flow towards the liver. Other: None. IMPRESSION: 1. Findings which could be seen in the setting of early cholecystitis. Correlation with HIDA scan may be useful given report of sonographic Murphy's and abundant sludge and small stones. Electronically Signed   By: 13/06/2019 M.D.   On: 08/10/2019 18:57   IMPRESSION: #1  Acute calculous cholecystitis S/p laparoscopic cholecystectomy with IOC 11/10 Dr. 13/10 - POD#1 - IOC positive - LFTs trending down #2  2 weeks post-partum  PLAN: -ERCP with Dr. Luisa Hart on 11/12 AM.  Patient is hopeful that if all goes well then maybe she can be discharged in the afternoon of 11/12 as well to get home to her newborn.  Will allow clears today then NPO after midnight.  I have discontinued her Lovenox and ordered a dose of intraoperative/perioperative cipro. -Trend LFT's.  13/12. Zehr  08/12/2019, 9:24 AM  GI ATTENDING  History, x-rays, IOC, laboratories reviewed.  Patient seen and examined.  Agree with comprehensive consultation note as outlined above.  Pleasant 33 year old who is 2 weeks postpartum.  Has been having biliary colic which worsened recently.  She was found to have abnormal liver tests and gallstones.  She underwent laparoscopic cholecystectomy yesterday.  IOC revealed small filling defects in the distal bile duct consistent with stones.  We were contacted regarding  ERCP.  She is an appropriate candidate without contraindication.The nature of the procedure, as well as the risks (pancreatitis, bleeding, perforation, infection), benefits,  and alternatives were carefully and thoroughly reviewed with the patient. Ample time for discussion and questions allowed. The patient understood, was satisfied, and agreed to proceed.  Docia Chuck. Geri Seminole., M.D. [facility] Division of Gastroenterology

## 2019-08-12 NOTE — Plan of Care (Signed)
°  Problem: Clinical Measurements: Goal: Cardiovascular complication will be avoided Outcome: Progressing   Problem: Pain Managment: Goal: General experience of comfort will improve Outcome: Progressing   Problem: Skin Integrity: Goal: Risk for impaired skin integrity will decrease Outcome: Progressing

## 2019-08-12 NOTE — Progress Notes (Signed)
[facility] Progress Note  1 Day Post-Op  Subjective: CC-  Sore but overall feeling ok. Tolerated liquids last night. No n/v. No flatus or BM. Ambulated to restroom without issues.  GI consult pending for positive IOC.  Objective: Vital signs in last 24 hours: Temp:  [97.7 F (36.5 C)-98.5 F (36.9 C)] 98.3 F (36.8 C) (11/11 0446) Pulse Rate:  [75-97] 81 (11/11 0446) Resp:  [10-18] 18 (11/11 0446) BP: (116-158)/(57-94) 118/68 (11/11 0446) SpO2:  [97 %-98 %] 97 % (11/11 0446)    Intake/Output from previous day: 11/10 0701 - 11/11 0700 In: 2119.3 [I.V.:2102.4; IV Piggyback:16.8] Out: 50 [Blood:50] Intake/Output this shift: No intake/output data recorded.  PE: Gen:  Alert, NAD, pleasant HEENT: EOM's intact, pupils equal and round Pulm:  Rate and effort normal Abd: Soft, ND, appropriately tender, +BS, incisions C/D/I Psych: A&Ox3  Skin: no rashes noted, warm and dry  Lab Results:  Recent Labs    08/11/19 0502 08/12/19 0511  WBC 7.3 7.3  HGB 14.6 12.8  HCT 43.1 38.3  PLT 248 226   BMET Recent Labs    08/11/19 0502 08/12/19 0511  NA 138 137  K 3.7 4.3  CL 101 106  CO2 24 21*  GLUCOSE 89 90  BUN 9 6  CREATININE 0.84 0.77  CALCIUM 9.2 8.8*   PT/INR No results for input(s): LABPROT, INR in the last 72 hours. CMP     Component Value Date/Time   NA 137 08/12/2019 0511   K 4.3 08/12/2019 0511   CL 106 08/12/2019 0511   CO2 21 (L) 08/12/2019 0511   GLUCOSE 90 08/12/2019 0511   BUN 6 08/12/2019 0511   CREATININE 0.77 08/12/2019 0511   CALCIUM 8.8 (L) 08/12/2019 0511   PROT 6.2 (L) 08/12/2019 0511   PROT 6.8 02/02/2014 1435   ALBUMIN 3.5 08/12/2019 0511   AST 82 (H) 08/12/2019 0511   ALT 158 (H) 08/12/2019 0511   ALKPHOS 141 (H) 08/12/2019 0511   BILITOT 1.1 08/12/2019 0511   GFRNONAA >60 08/12/2019 0511   GFRAA >60 08/12/2019 0511   Lipase     Component Value Date/Time   LIPASE 31 08/10/2019 1719       Studies/Results: Dg  Cholangiogram Operative  Result Date: 08/11/2019 CLINICAL DATA:  33 year old female with a history of cholecystitis EXAM: INTRAOPERATIVE CHOLANGIOGRAM TECHNIQUE: Cholangiographic images from the C-arm fluoroscopic device were submitted for interpretation post-operatively. Please see the procedural report for the amount of contrast and the fluoroscopy time utilized. COMPARISON:  None. FINDINGS: Surgical instruments project over the upper abdomen. There is cannulation of the cystic duct/gallbladder neck, with antegrade infusion of contrast. Caliber of the extrahepatic ductal system within normal limits. Vague filling defects at the distal common bile duct above the ampulla, which are partially obstructing. Contrast does enter the duodenum through the ampulla. IMPRESSION: Intraoperative cholangiogram demonstrates filling defects in the common bile duct above the ampulla, concerning for choledocholithiasis. Correlation with ERCP may be indicated. Please refer to the dictated operative report for full details of intraoperative findings and procedure Electronically Signed   By: Gilmer Mor D.O.   On: 08/11/2019 15:46   US Abdomen Limited Ruq  Result Date: 08/10/2019 CLINICAL DATA:  Right upper quadrant pain for 1 day. EXAM: ULTRASOUND ABDOMEN LIMITED RIGHT UPPER QUADRANT COMPARISON:  None. FINDINGS: Gallbladder: Extensive sludge in the dependent gallbladder with small stones. Gallbladder wall at 3 mm. Positive reported sonographic Murphy's sign. No signs of pericholecystic fluid. Common bile duct: Diameter: 5  mm Liver: No focal lesion identified. Within normal limits in parenchymal echogenicity. Portal vein is patent on color Doppler imaging with normal direction of blood flow towards the liver. Other: None. IMPRESSION: 1. Findings which could be seen in the setting of early cholecystitis. Correlation with HIDA scan may be useful given report of sonographic Murphy's and abundant sludge and small stones.  Electronically Signed   By: Donzetta Kohut M.D.   On: 08/10/2019 18:57    Anti-infectives: Anti-infectives (From admission, onward)   Start     Dose/Rate Route Frequency Ordered Stop   08/11/19 0445  ciprofloxacin (CIPRO) IVPB 400 mg  Status:  Discontinued     400 mg 200 mL/hr over 60 Minutes Intravenous Every 12 hours 08/11/19 0440 08/11/19 1642   08/11/19 0145  cefTRIAXone (ROCEPHIN) 1 g in sodium chloride 0.9 % 100 mL IVPB     1 g 200 mL/hr over 30 Minutes Intravenous  Once 08/11/19 0137 08/11/19 0237       Assessment/Plan 2 weeks postpartum  Acute calculous cholecystitis S/p laparoscopic cholecystectomy with IOC 11/10 Dr. Luisa Hart - POD#1 - IOC positive - LFTs trending down  ID - periop FEN - IVF, NPO VTE - SCDs, lovenox Foley - none Follow up - [facility]  Plan: Keep NPO until seen by GI in case she undergoes ERCP today. Mobilize.    LOS: 0 days    Franne Forts, [facility] 08/12/2019, 8:45 AM Please see Amion for pager number during day hours 7:00am-4:30pm

## 2019-08-12 NOTE — H&P (View-Only) (Signed)
° °Referring Provider:  Dr. Cornett, CCS °Primary Care Physician:  Patient, No Pcp Per °Primary Gastroenterologist:  Unassigned ° °Reason for Consultation:  Positive IOC ° °HPI: Linda Griffith is a 33 y.o. female who is 2 weeks post-partum.  Had lap chole on 11/10 for what they called acalculous cholecystitis, had positive IOC. ° °Ultrasound on admission showed the following: ° °IMPRESSION: °1. Findings which could be seen in the setting of early °cholecystitis. Correlation with HIDA scan may be useful given report °of sonographic Murphy's and abundant sludge and small stones. ° °LFT's trending down this AM actually with AST 82, ALT 158, ALP 141, and total bili 1.1. ° °Past Medical History:  °Diagnosis Date  °• Joint pain   ° knee, and lower leg  °• Migraine without aura   °• Multiple sclerosis (HCC) 03/2014  °• Muscle spasms of lower extremity   °• Paresthesias 02/02/2014  °• Sinusitis   °• Sprain of thoracic region   ° Thoracic region  ° ° °Past Surgical History:  °Procedure Laterality Date  °• CHOLECYSTECTOMY N/A 08/11/2019  ° Procedure: LAPAROSCOPIC CHOLECYSTECTOMY WITH INTRAOPERATIVE CHOLANGIOGRAM;  Surgeon: Cornett, Thomas, MD;  Location: [facility] OR;  Service: General;  Laterality: N/A;  °• NO PAST SURGERIES    ° ° °Prior to Admission medications   °Medication Sig Start Date End Date Taking? Authorizing Provider  °pantoprazole (PROTONIX) 40 MG tablet Take 40 mg by mouth every evening. 06/22/19  Yes [provider]  °Prenatal Vit-Fe Fumarate-FA (PRENATAL MULTIVITAMIN) TABS tablet Take 1 tablet by mouth at bedtime.   Yes [provider]  ° ° °Current Facility-Administered Medications  °Medication Dose Route Frequency Provider Last Rate Last Dose  °• acetaminophen (TYLENOL) tablet 1,000 mg  1,000 mg Oral Q8H Meuth, Brooke A, PA-C   1,000 mg at 08/12/19 0709  °• dextrose 5 % and 0.45 % NaCl with KCl 20 mEq/L infusion   Intravenous Continuous Meuth, Brooke A, PA-C 75 mL/hr at 08/12/19 0539    °•  diphenhydrAMINE (BENADRYL) 12.5 MG/5ML elixir 12.5 mg  12.5 mg Oral Q6H PRN Meuth, Brooke A, PA-C      ° Or  °• diphenhydrAMINE (BENADRYL) injection 12.5 mg  12.5 mg Intravenous Q6H PRN Meuth, Brooke A, PA-C      °• enoxaparin (LOVENOX) injection 40 mg  40 mg Subcutaneous Q24H Meuth, Brooke A, PA-C   40 mg at 08/11/19 2124  °• lactated ringers infusion   Intravenous Continuous Meuth, Brooke A, PA-C 10 mL/hr at 08/11/19 1347    °• morphine 2 MG/ML injection 1-2 mg  1-2 mg Intravenous Q2H PRN Meuth, Brooke A, PA-C   1 mg at 08/12/19 0923  °• ondansetron (ZOFRAN-ODT) disintegrating tablet 4 mg  4 mg Oral Q6H PRN Meuth, Brooke A, PA-C      ° Or  °• ondansetron (ZOFRAN) injection 4 mg  4 mg Intravenous Q6H PRN Meuth, Brooke A, PA-C      °• oxyCODONE (Oxy IR/ROXICODONE) immediate release tablet 5-10 mg  5-10 mg Oral Q4H PRN Meuth, Brooke A, PA-C      °• pantoprazole (PROTONIX) injection 40 mg  40 mg Intravenous QHS Meuth, Brooke A, PA-C   40 mg at 08/11/19 2125  °• prenatal multivitamin tablet 1 tablet  1 tablet Oral QHS Meuth, Brooke A, PA-C   1 tablet at 08/11/19 2123  °• simethicone (MYLICON) chewable tablet 40 mg  40 mg Oral Q6H PRN Meuth, Brooke A, PA-C      °• sodium chloride flush (  NS) 0.9 % injection 3 mL  3 mL Intravenous Once Meuth, Brooke A, PA-C        Allergies as of 08/10/2019 - Review Complete 07/23/2019  Allergen Reaction Noted   Amoxicillin Rash 02/01/2014   Penicillins Hives and Rash 02/01/2014    Family History  Problem Relation Age of Onset   Colon cancer Mother    Hypertension Brother     Social History   Socioeconomic History   Marital status: Married    Spouse name: Not on file   Number of children: Not on file   Years of education: Not on file   Highest education level: Not on file  Occupational History   Not on file  Social Needs   Financial resource strain: Not on file   Food insecurity    Worry: Not on file    Inability: Not on file   Transportation  needs    Medical: Not on file    Non-medical: Not on file  Tobacco Use   Smoking status: Never Smoker   Smokeless tobacco: Never Used  Substance and Sexual Activity   Alcohol use: Not Currently    Comment: occ 3-6 drinks per week   Drug use: No   Sexual activity: Yes  Lifestyle   Physical activity    Days per week: Not on file    Minutes per session: Not on file   Stress: Not on file  Relationships   Social connections    Talks on phone: Not on file    Gets together: Not on file    Attends religious service: Not on file    Active member of club or organization: Not on file    Attends meetings of clubs or organizations: Not on file    Relationship status: Not on file   Intimate partner violence    Fear of current or ex partner: Not on file    Emotionally abused: Not on file    Physically abused: Not on file    Forced sexual activity: Not on file  Other Topics Concern   Not on file  Social History Narrative   Married, no children   Right handed   Some college   1 cup daily    Review of Systems: ROS is O/W negative except as mentioned in HPI.  Physical Exam: Vital signs in last 24 hours: Temp:  [97.7 F (36.5 C)-98.5 F (36.9 C)] 98.3 F (36.8 C) (11/11 0446) Pulse Rate:  [75-97] 81 (11/11 0446) Resp:  [10-18] 18 (11/11 0446) BP: (116-158)/(57-94) 118/68 (11/11 0446) SpO2:  [97 %-98 %] 97 % (11/11 0446)   General:  Alert, Well-developed, well-nourished, pleasant and cooperative in NAD Head:  Normocephalic and atraumatic. Eyes:  Sclera clear, no icterus.  Conjunctiva pink. Ears:  Normal auditory acuity. Mouth:  No deformity or lesions.   Lungs:  Clear throughout to auscultation.  No wheezes, crackles, or rhonchi.  Heart:  Regular rate and rhythm; no murmurs, clicks, rubs, or gallops. Abdomen:  Soft, non-distended.  BS present.  Appropriate TTP.  Msk:  Symmetrical without gross deformities. Pulses:  Normal pulses noted. Extremities:  Without clubbing  or edema. Neurologic:  Alert and oriented x 4;  grossly normal neurologically. Skin:  Intact without significant lesions or rashes. Psych:  Alert and cooperative. Normal mood and affect.  Intake/Output from previous day: 11/10 0701 - 11/11 0700 In: 2119.3 [I.V.:2102.4; IV Piggyback:16.8] Out: 50 [Blood:50]  Lab Results: Recent Labs    08/10/19 1719 08/11/19 0502 08/12/19  0511  WBC 10.7* 7.3 7.3  HGB 14.6 14.6 12.8  HCT 42.2 43.1 38.3  PLT 264 248 226   BMET Recent Labs    08/10/19 1719 08/11/19 0502 08/12/19 0511  NA 137 138 137  K 3.8 3.7 4.3  CL 102 101 106  CO2 22 24 21*  GLUCOSE 101* 89 90  BUN 12 9 6   CREATININE 0.92 0.84 0.77  CALCIUM 9.7 9.2 8.8*   LFT Recent Labs    08/12/19 0511  PROT 6.2*  ALBUMIN 3.5  AST 82*  ALT 158*  ALKPHOS 141*  BILITOT 1.1   Studies/Results: Dg Cholangiogram Operative  Result Date: 08/11/2019 CLINICAL DATA:  33 year old female with a history of cholecystitis EXAM: INTRAOPERATIVE CHOLANGIOGRAM TECHNIQUE: Cholangiographic images from the C-arm fluoroscopic device were submitted for interpretation post-operatively. Please see the procedural report for the amount of contrast and the fluoroscopy time utilized. COMPARISON:  None. FINDINGS: Surgical instruments project over the upper abdomen. There is cannulation of the cystic duct/gallbladder neck, with antegrade infusion of contrast. Caliber of the extrahepatic ductal system within normal limits. Vague filling defects at the distal common bile duct above the ampulla, which are partially obstructing. Contrast does enter the duodenum through the ampulla. IMPRESSION: Intraoperative cholangiogram demonstrates filling defects in the common bile duct above the ampulla, concerning for choledocholithiasis. Correlation with ERCP may be indicated. Please refer to the dictated operative report for full details of intraoperative findings and procedure Electronically Signed   By: 32 D.O.    On: 08/11/2019 15:46   13/07/2019 Abdomen Limited Ruq  Result Date: 08/10/2019 CLINICAL DATA:  Right upper quadrant pain for 1 day. EXAM: ULTRASOUND ABDOMEN LIMITED RIGHT UPPER QUADRANT COMPARISON:  None. FINDINGS: Gallbladder: Extensive sludge in the dependent gallbladder with small stones. Gallbladder wall at 3 mm. Positive reported sonographic Murphy's sign. No signs of pericholecystic fluid. Common bile duct: Diameter: 5 mm Liver: No focal lesion identified. Within normal limits in parenchymal echogenicity. Portal vein is patent on color Doppler imaging with normal direction of blood flow towards the liver. Other: None. IMPRESSION: 1. Findings which could be seen in the setting of early cholecystitis. Correlation with HIDA scan may be useful given report of sonographic Murphy's and abundant sludge and small stones. Electronically Signed   By: 13/06/2019 M.D.   On: 08/10/2019 18:57   IMPRESSION: #1  Acute calculous cholecystitis S/p laparoscopic cholecystectomy with IOC 11/10 Dr. 13/10 - POD#1 - IOC positive - LFTs trending down #2  2 weeks post-partum  PLAN: -ERCP with Dr. Luisa Hart on 11/12 AM.  Patient is hopeful that if all goes well then maybe she can be discharged in the afternoon of 11/12 as well to get home to her newborn.  Will allow clears today then NPO after midnight.  I have discontinued her Lovenox and ordered a dose of intraoperative/perioperative cipro. -Trend LFT's.  13/12. Zehr  08/12/2019, 9:24 AM  GI ATTENDING  History, x-rays, IOC, laboratories reviewed.  Patient seen and examined.  Agree with comprehensive consultation note as outlined above.  Pleasant 33 year old who is 2 weeks postpartum.  Has been having biliary colic which worsened recently.  She was found to have abnormal liver tests and gallstones.  She underwent laparoscopic cholecystectomy yesterday.  IOC revealed small filling defects in the distal bile duct consistent with stones.  We were contacted regarding  ERCP.  She is an appropriate candidate without contraindication.The nature of the procedure, as well as the risks (pancreatitis, bleeding, perforation, infection), benefits,  and alternatives were carefully and thoroughly reviewed with the patient. Ample time for discussion and questions allowed. The patient understood, was satisfied, and agreed to proceed.  Docia Chuck. Geri Seminole., M.D. [facility] Division of Gastroenterology

## 2019-08-13 ENCOUNTER — Encounter (HOSPITAL_COMMUNITY): Payer: Self-pay | Admitting: Anesthesiology

## 2019-08-13 ENCOUNTER — Inpatient Hospital Stay (HOSPITAL_COMMUNITY): Payer: BC Managed Care – PPO | Admitting: Anesthesiology

## 2019-08-13 ENCOUNTER — Encounter (HOSPITAL_COMMUNITY): Admission: EM | Disposition: A | Discharge status: Home / Self Care | Payer: Self-pay | Source: Home / Self Care

## 2019-08-13 ENCOUNTER — Inpatient Hospital Stay (HOSPITAL_COMMUNITY): Payer: BC Managed Care – PPO

## 2019-08-13 HISTORY — PX: SPHINCTEROTOMY: SHX5544

## 2019-08-13 HISTORY — PX: ERCP: SHX5425

## 2019-08-13 HISTORY — PX: REMOVAL OF STONES: SHX5545

## 2019-08-13 LAB — SURGICAL PATHOLOGY

## 2019-08-13 LAB — GLUCOSE, CAPILLARY: Glucose-Capillary: 78 mg/dL (ref 70–99)

## 2019-08-13 SURGERY — ERCP, WITH INTERVENTION IF INDICATED
Anesthesia: General

## 2019-08-13 MED ORDER — LIP MEDEX EX OINT
TOPICAL_OINTMENT | CUTANEOUS | Status: DC | PRN
  Administered 2019-08-13: 1 via TOPICAL
  Filled 2019-08-13: qty 7

## 2019-08-13 MED ORDER — OXYCODONE HCL 5 MG PO TABS: 5.0000 mg | ORAL_TABLET | Freq: Four times a day (QID) | ORAL | 0 refills | Status: AC | PRN

## 2019-08-13 MED ORDER — DEXAMETHASONE SODIUM PHOSPHATE 10 MG/ML IJ SOLN
INTRAMUSCULAR | Status: DC | PRN
  Administered 2019-08-13: 5 mg via INTRAVENOUS

## 2019-08-13 MED ORDER — INDOMETHACIN 25 MG SUPPOSITORY
RECTAL | Status: DC | PRN
  Administered 2019-08-13: 100 mg via RECTAL

## 2019-08-13 MED ORDER — SUGAMMADEX SODIUM 200 MG/2ML IV SOLN
INTRAVENOUS | Status: DC | PRN
  Administered 2019-08-13: 200 mg via INTRAVENOUS

## 2019-08-13 MED ORDER — ONDANSETRON HCL 4 MG/2ML IJ SOLN
INTRAMUSCULAR | Status: DC | PRN
  Administered 2019-08-13: 4 mg via INTRAVENOUS

## 2019-08-13 MED ORDER — SODIUM CHLORIDE 0.9 % IV SOLN
INTRAVENOUS | Status: DC | PRN
  Administered 2019-08-13: 30 mL

## 2019-08-13 MED ORDER — PROPOFOL 10 MG/ML IV BOLUS
INTRAVENOUS | Status: DC | PRN
  Administered 2019-08-13: 200 mg via INTRAVENOUS

## 2019-08-13 MED ORDER — ACETAMINOPHEN 500 MG PO TABS: 1000.0000 mg | ORAL_TABLET | Freq: Three times a day (TID) | ORAL | 0 refills | Status: AC | PRN

## 2019-08-13 MED ORDER — INDOMETHACIN 50 MG RE SUPP: 100.0000 mg | Freq: Once | RECTAL | Status: DC

## 2019-08-13 MED ORDER — LIDOCAINE 2% (20 MG/ML) 5 ML SYRINGE
INTRAMUSCULAR | Status: DC | PRN
  Administered 2019-08-13: 60 mg via INTRAVENOUS

## 2019-08-13 MED ORDER — FENTANYL CITRATE (PF) 100 MCG/2ML IJ SOLN
INTRAMUSCULAR | Status: DC | PRN
  Administered 2019-08-13: 100 ug via INTRAVENOUS

## 2019-08-13 MED ORDER — MIDAZOLAM HCL 5 MG/5ML IJ SOLN
INTRAMUSCULAR | Status: DC | PRN
  Administered 2019-08-13: 2 mg via INTRAVENOUS

## 2019-08-13 MED ORDER — INDOMETHACIN 50 MG RE SUPP
RECTAL | Status: AC
  Filled 2019-08-13: qty 2

## 2019-08-13 MED ORDER — ROCURONIUM BROMIDE 10 MG/ML (PF) SYRINGE
PREFILLED_SYRINGE | INTRAVENOUS | Status: DC | PRN
  Administered 2019-08-13: 70 mg via INTRAVENOUS

## 2019-08-13 MED ORDER — CIPROFLOXACIN IN D5W 400 MG/200ML IV SOLN
400.0000 mg | Freq: Once | INTRAVENOUS | Status: AC
  Administered 2019-08-13: 400 mg via INTRAVENOUS

## 2019-08-13 MED ORDER — CIPROFLOXACIN IN D5W 400 MG/200ML IV SOLN
INTRAVENOUS | Status: AC
  Filled 2019-08-13: qty 200

## 2019-08-13 MED ORDER — GLUCAGON HCL RDNA (DIAGNOSTIC) 1 MG IJ SOLR
INTRAMUSCULAR | Status: AC
  Filled 2019-08-13: qty 1

## 2019-08-13 MED ORDER — SODIUM CHLORIDE 0.9 % IV SOLN: INTRAVENOUS | Status: DC

## 2019-08-13 NOTE — Discharge Summary (Signed)
Linda Griffith   Patient ID: Linda Griffith MRN: [id number] DOB/AGE: Nov 26, 1985 33 y.o.  Admit date: 08/10/2019 Discharge date: 08/13/2019  Admitting Diagnosis: Acute calculus cholecystitis  Discharge Diagnosis Patient Active Problem List   Diagnosis Date Noted   Cholecystitis 08/11/2019   Encounter for induction of labor 07/23/2019   Preeclampsia 04/29/2015   Multiple sclerosis ([place]) 03/2014   Paresthesias 02/02/2014    Consultants Gastroenterology  Imaging: Dg Cholangiogram Operative  Result Date: 08/11/2019 CLINICAL DATA:  33 year old female with a history of cholecystitis EXAM: INTRAOPERATIVE CHOLANGIOGRAM TECHNIQUE: Cholangiographic images from the C-arm fluoroscopic device were submitted for interpretation post-operatively. Please see the procedural report for the amount of contrast and the fluoroscopy time utilized. COMPARISON:  None. FINDINGS: Surgical instruments project over the upper abdomen. There is cannulation of the cystic duct/gallbladder neck, with antegrade infusion of contrast. Caliber of the extrahepatic ductal system within normal limits. Vague filling defects at the distal common bile duct above the ampulla, which are partially obstructing. Contrast does enter the duodenum through the ampulla. IMPRESSION: Intraoperative cholangiogram demonstrates filling defects in the common bile duct above the ampulla, concerning for choledocholithiasis. Correlation with ERCP may be indicated. Please refer to the dictated operative report for full details of intraoperative findings and procedure Electronically Signed   By: Corrie Mckusick D.O.   On: 08/11/2019 15:46   Dg Ercp Biliary & Pancreatic Ducts  Result Date: 08/13/2019 CLINICAL DATA:  33 year old female with a history of choledocholithiasis EXAM: ERCP TECHNIQUE: Multiple spot images obtained with the fluoroscopic device and submitted for interpretation post-procedure. FLUOROSCOPY  TIME:  Fluoroscopy Time:  2 minutes 47 seconds COMPARISON:  None. FINDINGS: Limited fluoroscopic images during ERCP. Initial image demonstrates endoscope projecting over the upper abdomen. There is then retrograde cannulation of the biliary system with partial opacification of the intrahepatic biliary ducts and extrahepatic biliary ducts. Deployment of a balloon retrieval catheter. Surgical changes of cholecystectomy.  No extraluminal contrast. IMPRESSION: Limited images during ERCP demonstrates treatment of choledocholithiasis with deployment of a balloon retrieval catheter. Please refer to the dictated operative report for full details of intraoperative findings and procedure. Electronically Signed   By: Corrie Mckusick D.O.   On: 08/13/2019 10:00    Procedures Dr. Brantley Stage (08/11/19) - Laparoscopic Cholecystectomy with IOC Dr. Henrene Pastor (08/13/19) - ERCP  Hospital Course:  Linda Griffith is a 33yo female 2 weeks postpartum who presented to [facility] 11/10 with worsening abdominal pain.  Workup showed Acute calculus cholecystitis.  Patient was admitted and underwent laparoscopic cholecystectomy with IOC. Intraoperatively her IOC was found to be positive for choledocholithiasis. Tolerated procedure well and was transferred to the floor.  Gastroenterology was consulted postoperatively for ERCP which was successfully performed on 11/12. Diet was advanced as tolerated. On 11/12, the patient was voiding well, tolerating diet, ambulating well, pain well controlled, vital signs stable, incisions c/d/i and felt stable for discharge home.  Patient will follow up as below and knows to call with questions or concerns.    I have personally reviewed the patients medication history on the [place] controlled substance database.    Physical Exam: Gen:  Alert, NAD, pleasant HEENT: EOM's intact, pupils equal and round Pulm:  Rate and effort normal Abd: Soft, ND, appropriately tender, +BS, incisions C/D/I Psych: A&Ox3  Skin:  no rashes noted, warm and dry   Allergies as of 08/13/2019      Reactions   Amoxicillin Rash   Penicillins Hives, Rash   Did it involve swelling of  the face/tongue/throat, SOB, or low BP? Yes Did it involve sudden or severe rash/hives, skin peeling, or any reaction on the inside of your mouth or nose? Yes Did you need to seek medical attention at a hospital or doctor's office? Yes When did it last happen? If all above answers are NO, may proceed with cephalosporin use.      Medication List    TAKE these medications   acetaminophen 500 MG tablet Commonly known as: TYLENOL Take 2 tablets (1,000 mg total) by mouth every 8 (eight) hours as needed for mild pain.   oxyCODONE 5 MG immediate release tablet Commonly known as: Oxy IR/ROXICODONE Take 1 tablet (5 mg total) by mouth every 6 (six) hours as needed for severe pain.   pantoprazole 40 MG tablet Commonly known as: PROTONIX Take 40 mg by mouth every evening.   prenatal multivitamin Tabs tablet Take 1 tablet by mouth at bedtime.        Follow-up Information    [facility], [place]. Go on 09/01/2019.   Specialty: General Surgery Why: Your appointment is 09/01/19 at 11:15 am Please arrive 30 minutes prior to your appointment to check in and fill out paperwork. Bring photo ID and insurance information. Contact information: [address] [place] [address] [phone]          Signed: Franne Forts, [facility] 08/13/2019, 2:23 PM Please see Amion for pager number during day hours 7:00am-4:30pm

## 2019-08-13 NOTE — Interval H&P Note (Signed)
History and Physical Interval Note:  08/13/2019 8:34 AM  [name]  has presented today for surgery, with the diagnosis of Positive IOC.  The various methods of treatment have been discussed with the patient and family. After consideration of risks, benefits and other options for treatment, the patient has consented to  Procedure(s): ENDOSCOPIC RETROGRADE CHOLANGIOPANCREATOGRAPHY (ERCP) (N/A) as a surgical intervention.  The patient's history has been reviewed, patient examined, no change in status, stable for surgery.  I have reviewed the patient's chart and labs.  Questions were answered to the patient's satisfaction.    No issues overnight. No new questions. Abdomen is soft   Linda Griffith

## 2019-08-13 NOTE — Transfer of Care (Signed)
Immediate Anesthesia Transfer of Care Note  Patient: Linda Griffith  Procedure(s) Performed: ENDOSCOPIC RETROGRADE CHOLANGIOPANCREATOGRAPHY (ERCP) (N/A ) SPHINCTEROTOMY  Patient Location: Endoscopy Unit  Anesthesia Type:General  Level of Consciousness: awake, alert  and oriented  Airway & Oxygen Therapy: Patient Spontanous Breathing  Post-op Assessment: Report given to RN and Post -op Vital signs reviewed and stable  Post vital signs: Reviewed and stable  Last Vitals:  Vitals Value Taken Time  BP 147/85 08/13/19 0939  Temp 36.4 C 08/13/19 0939  Pulse 90 08/13/19 0939  Resp 12 08/13/19 0939  SpO2 100 % 08/13/19 0939    Last Pain:  Vitals:   08/13/19 0939  TempSrc: Temporal  PainSc: 0-No pain      Patients Stated Pain Goal: 2 (62/69/48 5462)  Complications: No apparent anesthesia complications

## 2019-08-13 NOTE — Anesthesia Procedure Notes (Addendum)
Procedure Name: Intubation Date/Time: 08/13/2019 8:44 AM Performed by: Kyung Rudd, CRNA Pre-anesthesia Checklist: Patient identified, Emergency Drugs available, Suction available and Patient being monitored Patient Re-evaluated:Patient Re-evaluated prior to induction Oxygen Delivery Method: Circle system utilized Preoxygenation: Pre-oxygenation with 100% oxygen Induction Type: IV induction Ventilation: Mask ventilation without difficulty Laryngoscope Size: Mac and 3 Grade View: Grade I Tube type: Oral Tube size: 7.0 mm Number of attempts: 1 Airway Equipment and Method: Stylet Placement Confirmation: ETT inserted through vocal cords under direct vision,  positive ETCO2 and breath sounds checked- equal and bilateral Secured at: 20 cm Tube secured with: Tape Dental Injury: Teeth and Oropharynx as per pre-operative assessment

## 2019-08-13 NOTE — Op Note (Signed)
[facility] Patient Name: Linda Griffith Procedure Date : 08/13/2019 MRN: [id number] Attending MD: Docia Chuck. Henrene Pastor , MD Date of Birth: [birth year] CSN: [id number] Age: 33 Admit Type: Inpatient Procedure:                ERCP with biliary sphincterotomy and common duct                            stone extraction Indications:              Abdominal pain of suspected biliary origin,                            Abnormal intraoperative cholangiogram, Elevated                            liver enzymes Providers:                Docia Chuck. Henrene Pastor, MD, Cleda Daub, RN, William Dalton, Technician Referring MD:             [facility] surgery Medicines:                General Anesthesia Complications:            No immediate complications. Estimated Blood Loss:     Estimated blood loss: none. Procedure:                Pre-Anesthesia Assessment:                           - Prior to the procedure, a History and Physical                            was performed, and patient medications and                            allergies were reviewed. The patient is competent.                            The risks and benefits of the procedure and the                            sedation options and risks were discussed with the                            patient. All questions were answered and informed                            consent was obtained. Patient identification and                            proposed procedure were verified by the physician.                            Mental Status Examination:  alert and oriented.                            Airway Examination: normal oropharyngeal airway and                            neck mobility. Respiratory Examination: clear to                            auscultation. CV Examination: normal. Prophylactic                            Antibiotics: The patient does not require                            prophylactic  antibiotics. Prior Anticoagulants: The                            patient has taken no previous anticoagulant or                            antiplatelet agents. ASA Grade Assessment: I - A                            normal, healthy patient. After reviewing the risks                            and benefits, the patient was deemed in                            satisfactory condition to undergo the procedure.                            The anesthesia plan was to use moderate sedation /                            analgesia (conscious sedation). Immediately prior                            to administration of medications, the patient was                            re-assessed for adequacy to receive sedatives. The                            heart rate, respiratory rate, oxygen saturations,                            blood pressure, adequacy of pulmonary ventilation,                            and response to care were monitored throughout the  procedure. The physical status of the patient was                            re-assessed after the procedure.                           After obtaining informed consent, the scope was                            passed under direct vision. Throughout the                            procedure, the patient's blood pressure, pulse, and                            oxygen saturations were monitored continuously. The                            [id number]-Q180V ([id number] Olympus duodenoscope was                            introduced through the mouth, and used to inject                            contrast into and used to inject contrast into the                            bile duct. The ERCP was accomplished without                            difficulty. The patient tolerated the procedure                            well. Scope In: Scope Out: Findings:      The esophagus was successfully intubated under direct vision. The scope       was  advanced to a normal major papilla in the descending duodenum       without detailed examination of the pharynx, larynx and associated       structures, and upper GI tract. The upper GI tract was grossly normal.       The bile duct was deeply cannulated with the traction (standard)       sphincterotome. Contrast was injected. I personally interpreted the bile       duct images. Opacification of the entire biliary tree except for the       gallbladder was successful. The lower third of the main bile duct       contained two tiny stones, the largest of which was 2 mm in diameter.       The in the biliary system was not dilated. A cholecystectomy had been       performed and cholecystectomy clips noted on scout film. A 0.035 inch       straight standard wire was passed into the biliary tree after injection       of contrast into the biliary tree. There is no injection of contrast  into the pancreatic duct or manipulation of the pancreatic duct via       wire. Biliary sphincterotomy was made with a traction (standard)       sphincterotome using ERBE electrocautery. There was no       post-sphincterotomy bleeding. The biliary tree was swept with a 9 mm       balloon starting at the bifurcation. All stones were removed. No stones       remained. Excellent drainage of the post extraction occlusion       cholangiogram Impression:               1. Choledocholithiasis status post ERC with                            sphincterotomy and stone extraction. Recommendation:           1. Observe patient's clinical course post procedure                           2. Standard hydration and indomethacin                            suppositories post procedure                           3. Patient may be discharged home later this                            afternoon from a GI standpoint if having no new                            complaints                           Please call for questions or problems. We  will sign                            off. I have discussed this with the patient and                            provided her a copy of this endoscopy report. Procedure Code(s):        --- Professional ---                           [id number], Endoscopic retrograde                            cholangiopancreatography (ERCP); with removal of                            calculi/debris from biliary/pancreatic duct(s)                           43262, Endoscopic retrograde                            cholangiopancreatography (ERCP); with  sphincterotomy/papillotomy Diagnosis Code(s):        --- Professional ---                           Z90.49, Acquired absence of other specified parts                            of digestive tract                           K80.50, Calculus of bile duct without cholangitis                            or cholecystitis without obstruction                           R10.9, Unspecified abdominal pain                           R74.8, Abnormal levels of other serum enzymes                           K83.8, Other specified diseases of biliary tract                           R93.2, Abnormal findings on diagnostic imaging of                            liver and biliary tract CPT copyright 2019 [employer]. All rights reserved. The codes documented in this report are preliminary and upon coder review may  be revised to meet current compliance requirements. Wilhemina Bonito. Marina Goodell, MD 08/13/2019 9:32:02 AM This report has been signed electronically. Number of Addenda: 0

## 2019-08-13 NOTE — Anesthesia Preprocedure Evaluation (Addendum)
Anesthesia Evaluation  Patient identified by MRN, date of birth, ID band Patient awake    Reviewed: Allergy & Precautions, NPO status , Patient's Chart, lab work & pertinent test results  History of Anesthesia Complications Negative for: history of anesthetic complications  Airway Mallampati: I  TM Distance: >3 FB Neck ROM: Full    Dental  (+) Teeth Intact, Dental Advisory Given   Pulmonary neg pulmonary ROS, neg recent URI,    breath sounds clear to auscultation       Cardiovascular negative cardio ROS   Rhythm:Regular     Neuro/Psych negative psych ROS   GI/Hepatic negative GI ROS, Neg liver ROS,   Endo/Other  negative endocrine ROS  Renal/GU negative Renal ROS     Musculoskeletal negative musculoskeletal ROS (+)   Abdominal   Peds  Hematology negative hematology ROS (+)   Anesthesia Other Findings   Reproductive/Obstetrics                            Anesthesia Physical Anesthesia Plan  ASA: II  Anesthesia Plan: General   Post-op Pain Management:    Induction: Intravenous, Rapid sequence and Cricoid pressure planned  PONV Risk Score and Plan: 3 and Ondansetron and Dexamethasone  Airway Management Planned: Oral ETT  Additional Equipment: None  Intra-op Plan:   Post-operative Plan: Extubation in OR  Informed Consent: I have reviewed the patients History and Physical, chart, labs and discussed the procedure including the risks, benefits and alternatives for the proposed anesthesia with the patient or authorized representative who has indicated his/her understanding and acceptance.     Dental advisory given  Plan Discussed with: CRNA and Surgeon  Anesthesia Plan Comments:         Anesthesia Quick Evaluation

## 2019-08-14 ENCOUNTER — Encounter (HOSPITAL_COMMUNITY): Payer: Self-pay | Admitting: Internal Medicine

## 2019-08-14 NOTE — Anesthesia Postprocedure Evaluation (Signed)
Anesthesia Post Note  Patient: Linda Griffith  Procedure(s) Performed: ENDOSCOPIC RETROGRADE CHOLANGIOPANCREATOGRAPHY (ERCP) (N/A ) SPHINCTEROTOMY REMOVAL OF STONES     Patient location during evaluation: Endoscopy Anesthesia Type: General Level of consciousness: awake and alert Pain management: pain level controlled Vital Signs Assessment: post-procedure vital signs reviewed and stable Respiratory status: spontaneous breathing, nonlabored ventilation, respiratory function stable and patient connected to nasal cannula oxygen Cardiovascular status: blood pressure returned to baseline and stable Postop Assessment: no apparent nausea or vomiting Anesthetic complications: no    Last Vitals:  Vitals:   08/13/19 1000 08/13/19 1007  BP: 134/71 134/82  Pulse: 70 65  Resp: 16 15  Temp:    SpO2: 99% 99%    Last Pain:  Vitals:   08/13/19 1045  TempSrc:   PainSc: 0-No pain                 [place] Bryngelson

## 2019-12-28 IMAGING — US US ABDOMEN LIMITED
1 series · 14 of 25 positions shown · non-contrast
Comparison: None.

CLINICAL DATA: Right upper quadrant pain for 1 day.

EXAM:
ULTRASOUND ABDOMEN LIMITED RIGHT UPPER QUADRANT

[Series 1: us abdomen limited · 54 acquisitions, 14 frames shown]
[im 1/54]
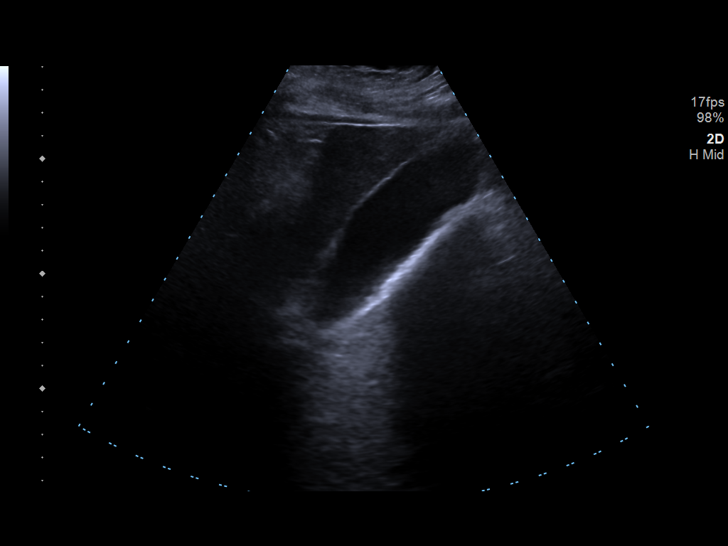
[im 5/54]
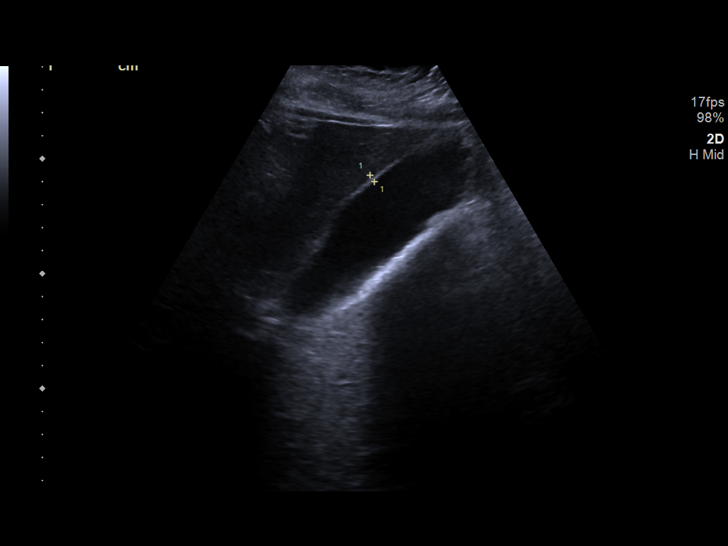
[im 9/54]
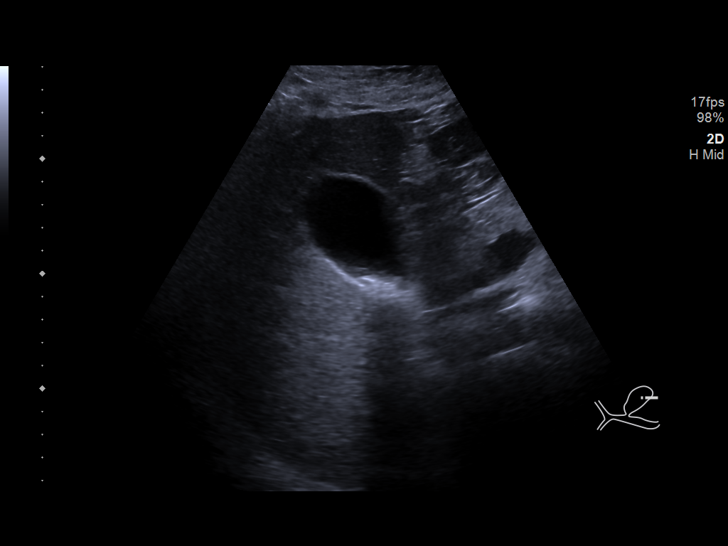
[im 14/54]
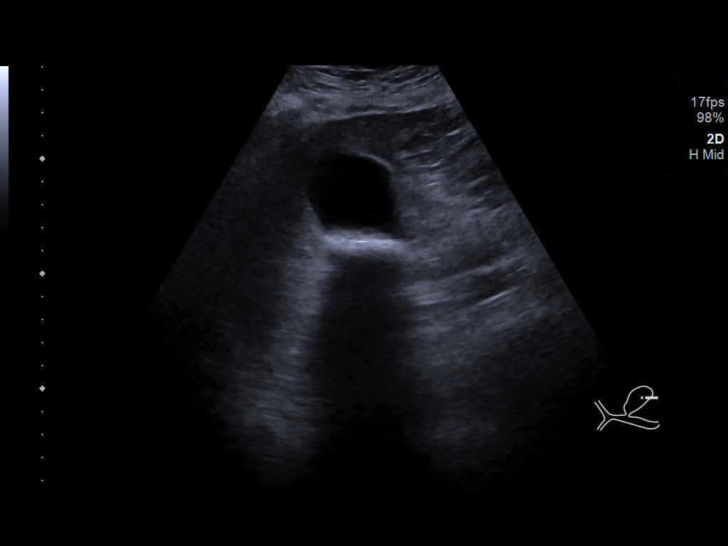
[im 18/54]
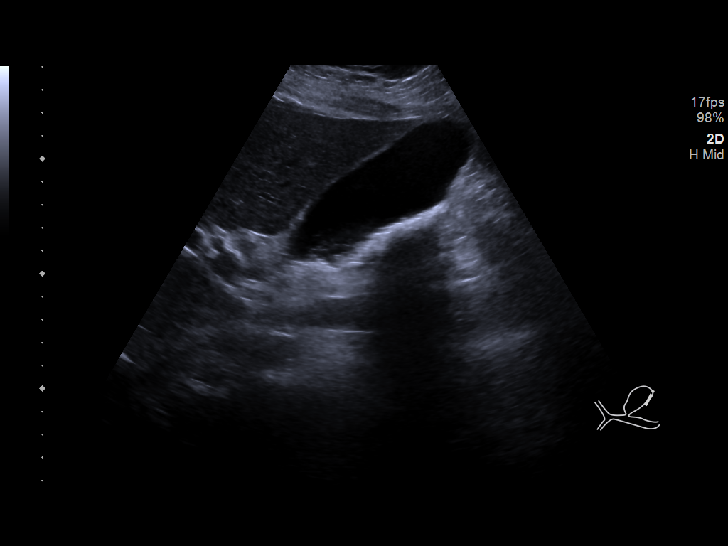
[im 20/54]
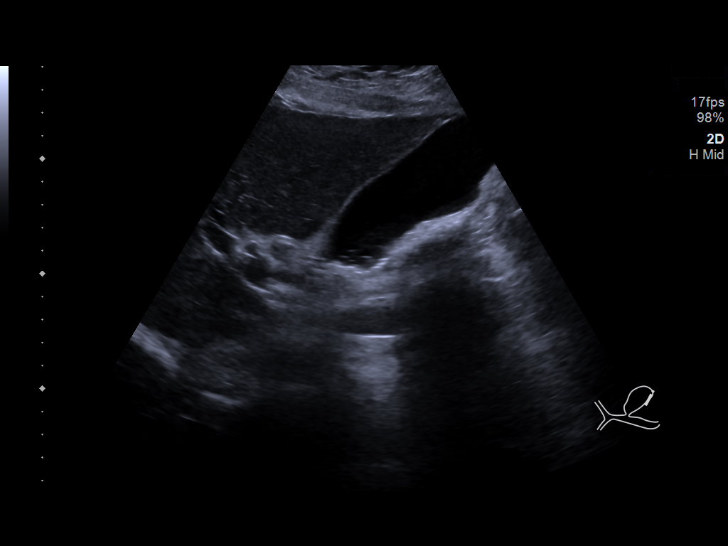
[im 25/54]
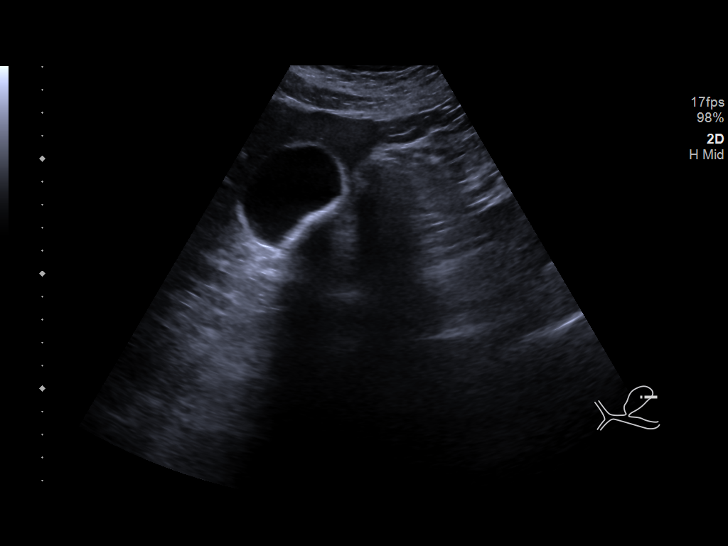
[im 29/54]
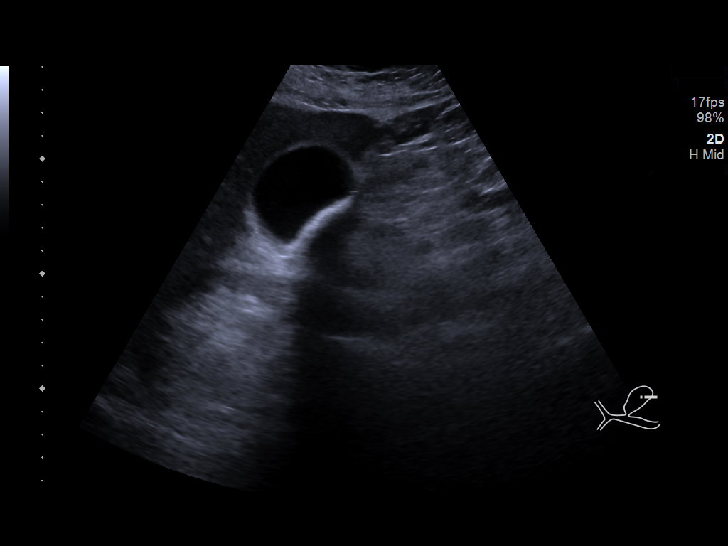
[im 34/54]
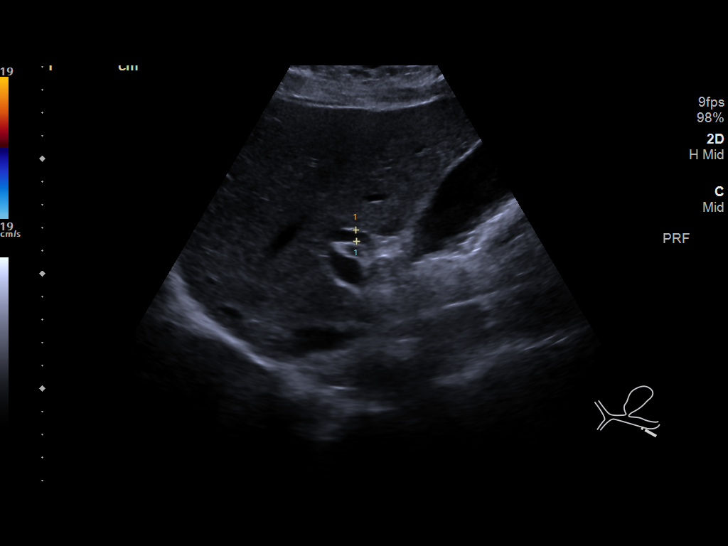
[im 36/54]
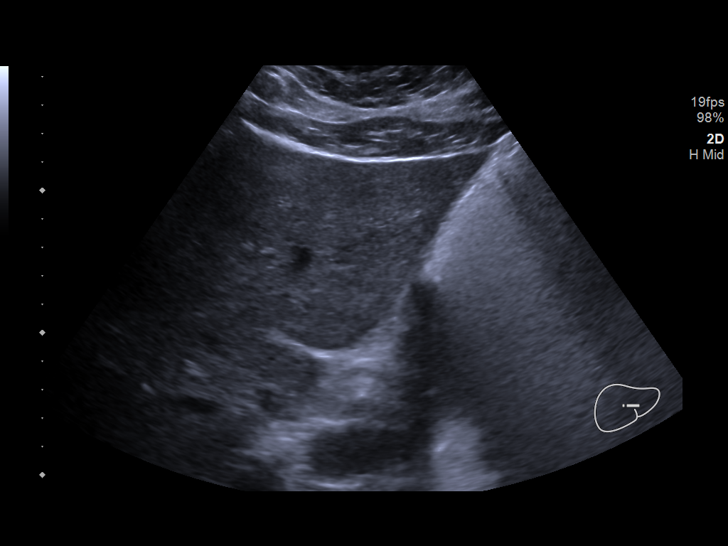
[im 40/54]
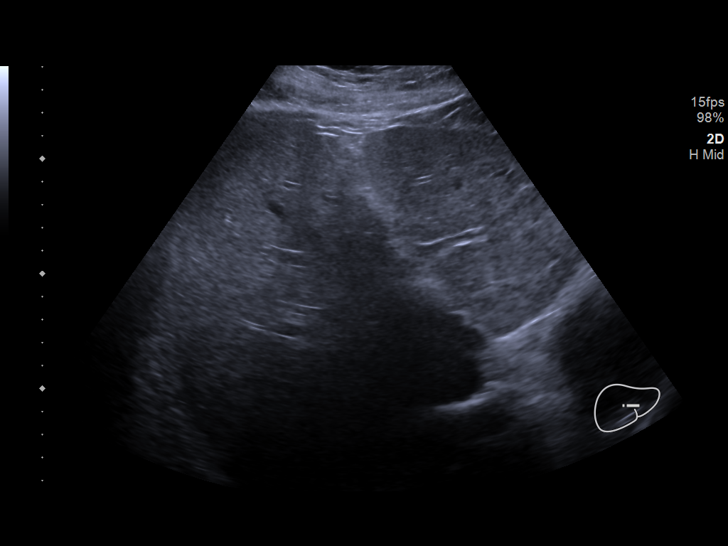
[im 45/54]
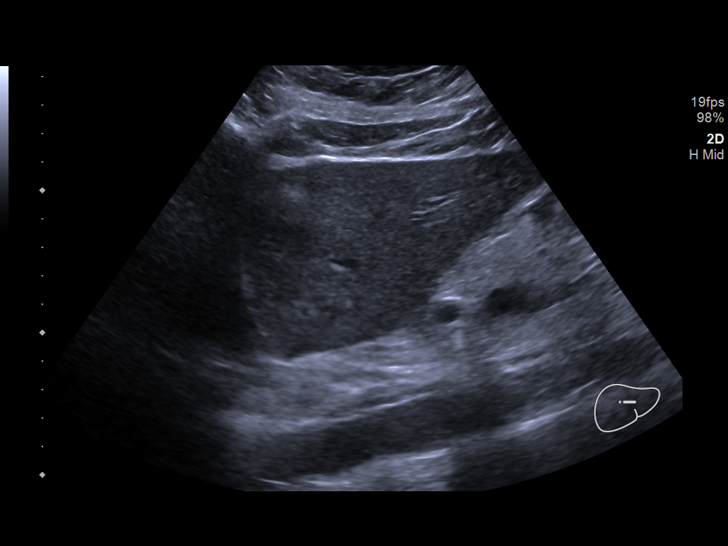
[im 49/54]
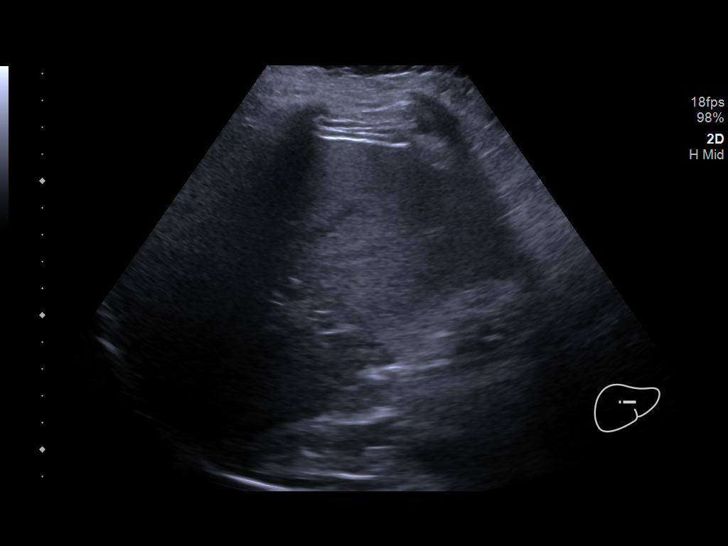
[im 54/54]
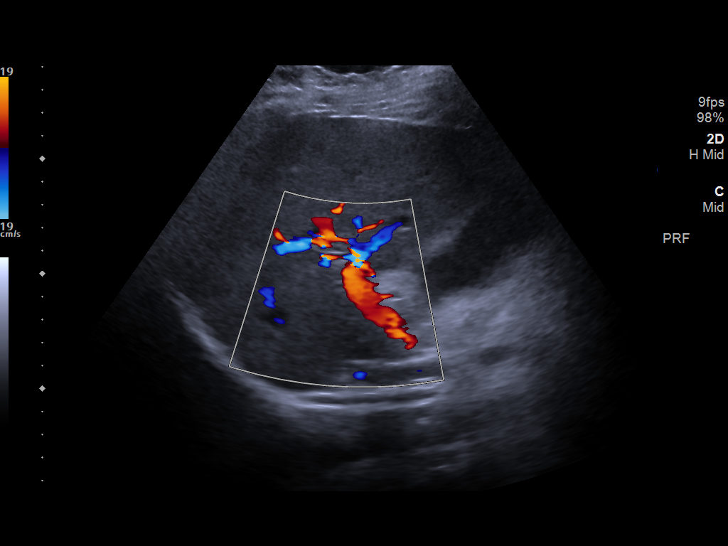

[14 of 25 positions shown; findings below may reference images not displayed]

FINDINGS: Gallbladder:

Extensive sludge in the dependent gallbladder with small stones.
Gallbladder wall at 3 mm. Positive reported sonographic Murphy's
sign. No signs of pericholecystic fluid.

Common bile duct:

Diameter: 5 mm

Liver:

No focal lesion identified. Within normal limits in parenchymal
echogenicity. Portal vein is patent on color Doppler imaging with
normal direction of blood flow towards the liver.

Other: None.
IMPRESSION: 1. Findings which could be seen in the setting of early
cholecystitis. Correlation with HIDA scan may be useful given report
of sonographic Gera and abundant sludge and small stones.

## 2019-12-31 IMAGING — RF DG ERCP WO/W SPHINCTEROTOMY
1 series · 12 of 12 positions shown · non-contrast
Comparison: None.

CLINICAL DATA: 33-year-old female with a history of
choledocholithiasis

EXAM:
ERCP
TECHNIQUE: Multiple spot images obtained with the fluoroscopic device and
submitted for interpretation post-procedure.
FLUOROSCOPY TIME:  Fluoroscopy Time:  2 minutes 47 seconds

[Series 1: unknown protocol · 0.20mm/px · 12 of 12 slices shown]
[im 1/12]
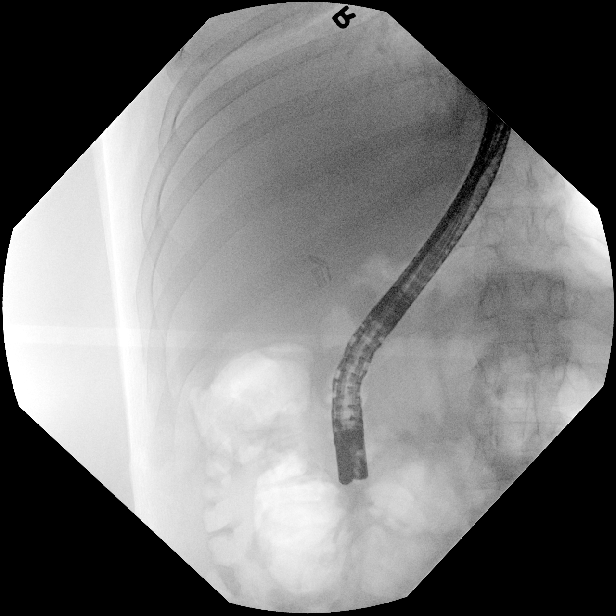
[im 2/12]
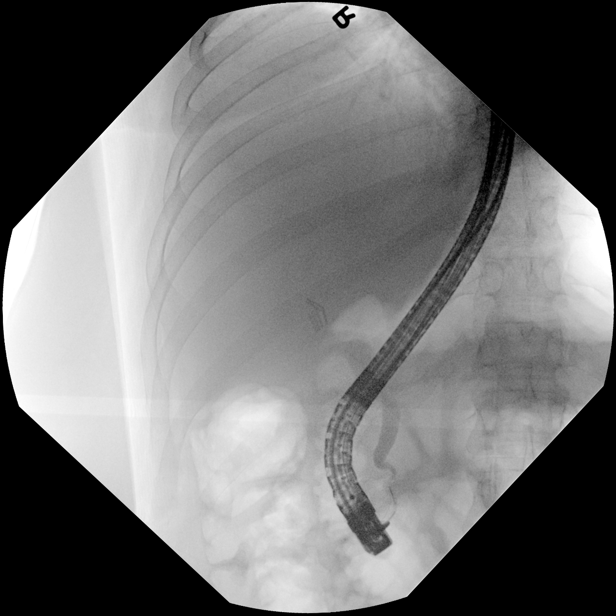
[im 3/12]
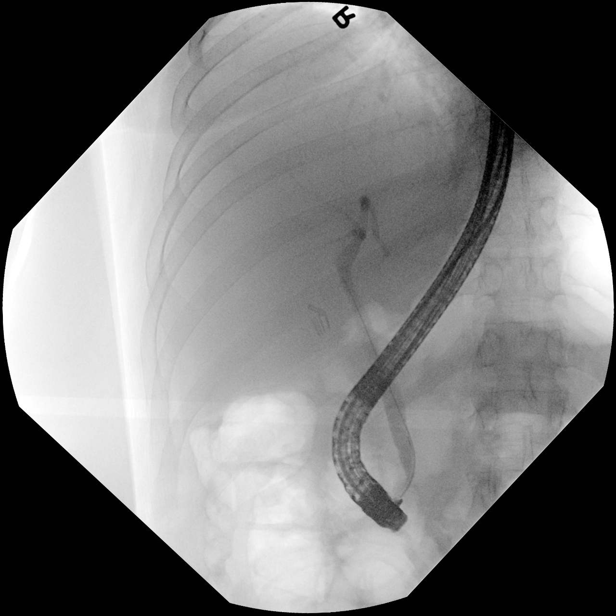
[im 4/12]
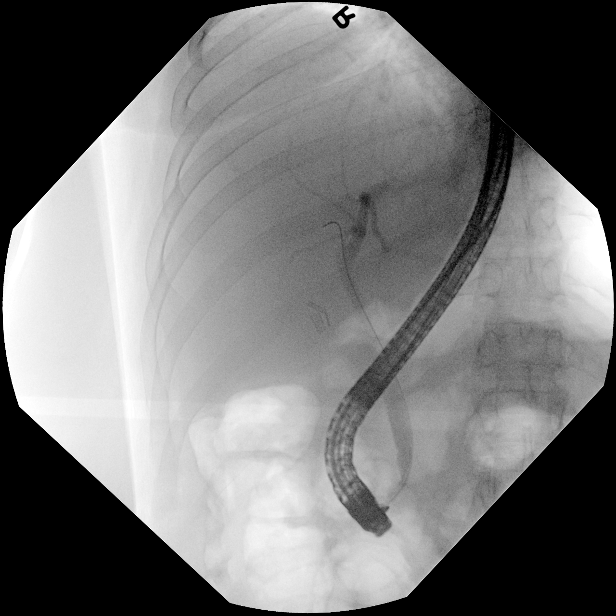
[im 5/12]
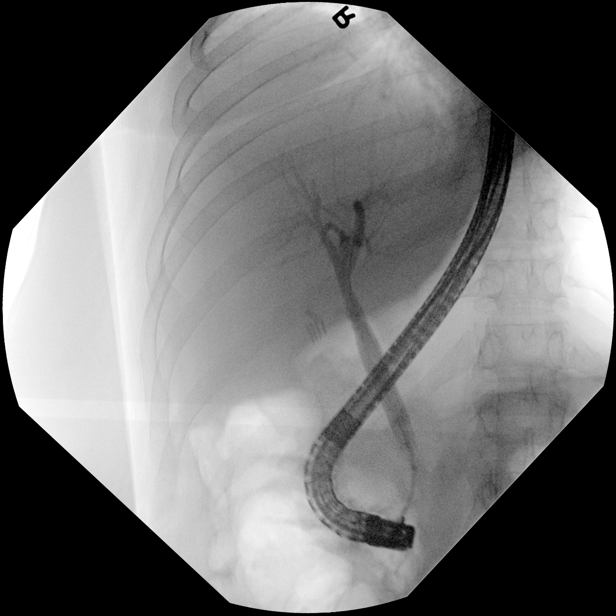
[im 6/12]
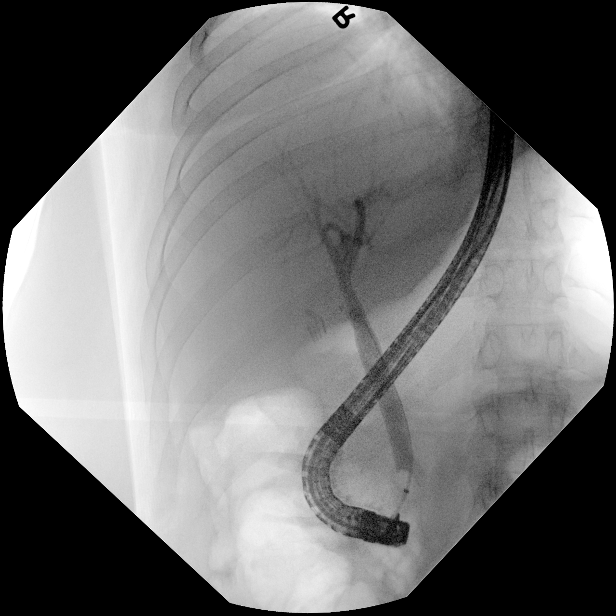
[im 7/12]
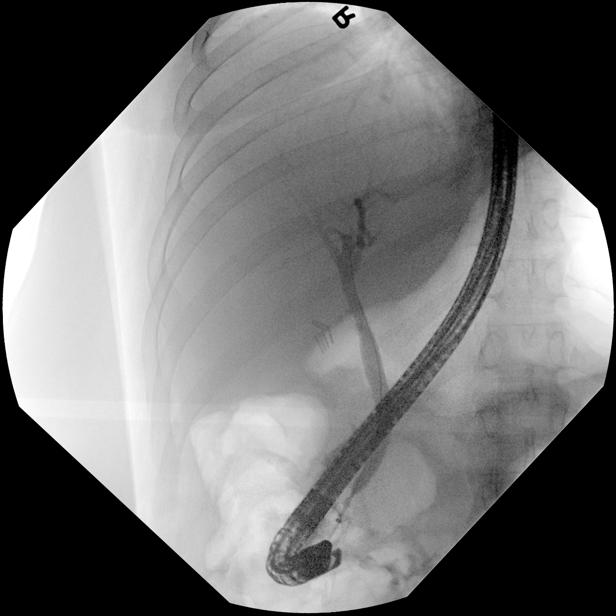
[im 8/12]
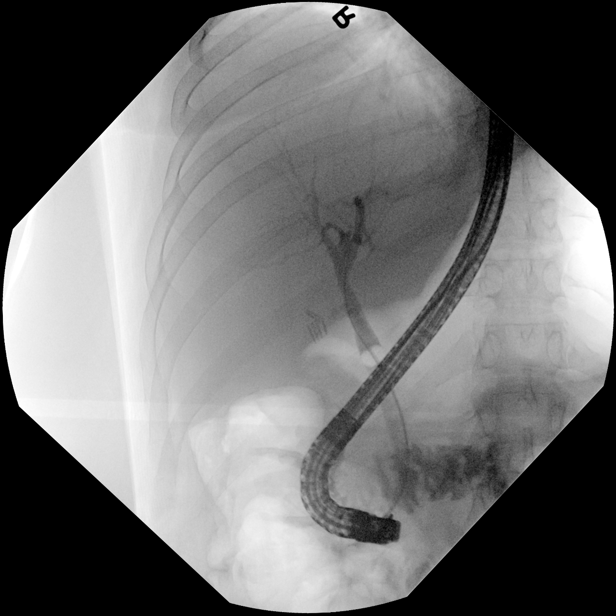
[im 9/12]
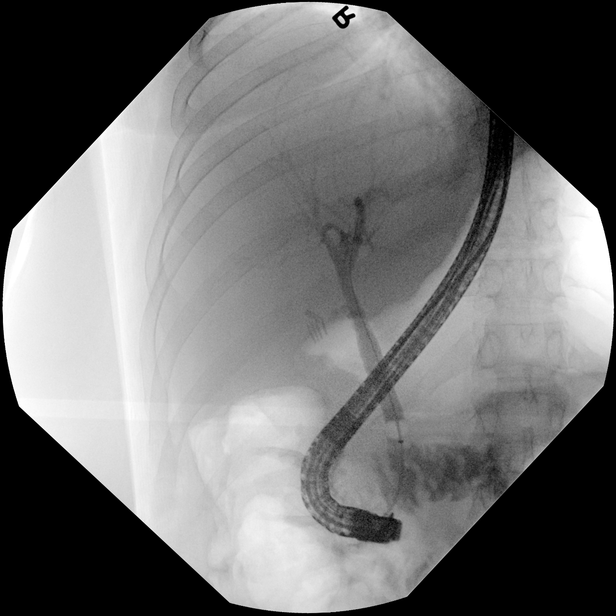
[im 10/12]
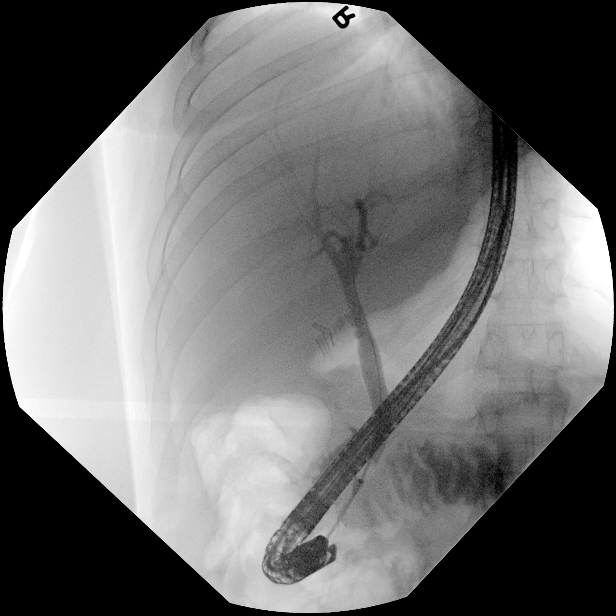
[im 11/12]
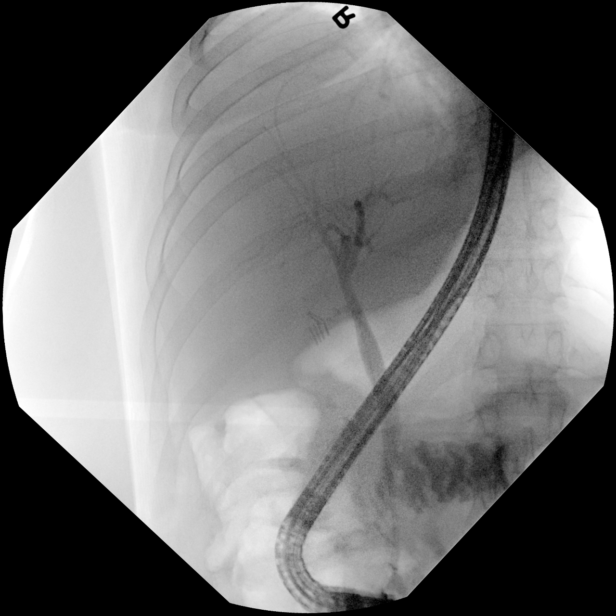
[im 12/12]
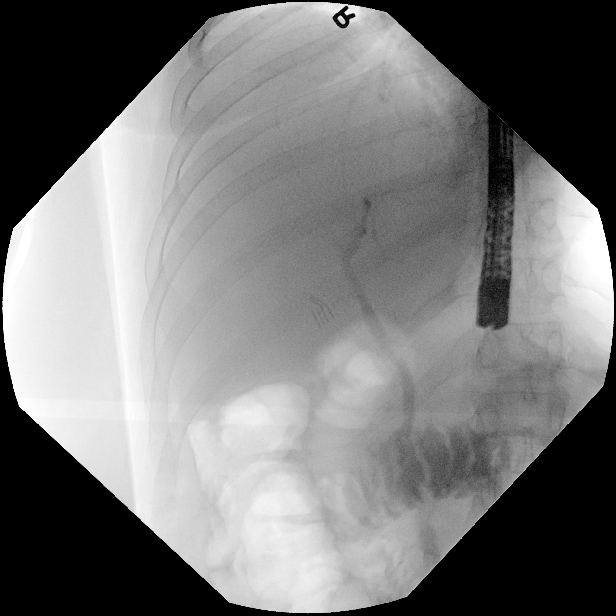

[12 of 12 positions shown; findings below may reference images not displayed]

FINDINGS: Limited fluoroscopic images during ERCP. Initial image demonstrates
endoscope projecting over the upper abdomen. There is then
retrograde cannulation of the biliary system with partial
opacification of the intrahepatic biliary ducts and extrahepatic
biliary ducts.

Deployment of a balloon retrieval catheter.

Surgical changes of cholecystectomy.  No extraluminal contrast.
IMPRESSION: Limited images during ERCP demonstrates treatment of
choledocholithiasis with deployment of a balloon retrieval catheter.

Please refer to the dictated operative report for full details of
intraoperative findings and procedure.
